# Patient Record
Sex: Female | Born: 1970 | Race: White | Hispanic: No | Marital: Married | Smoking: Never smoker
Health system: Southern US, Community
[De-identification: ages and names within clinical notes are randomized; demographics above are authoritative.]

## PROBLEM LIST (undated history)

## (undated) DIAGNOSIS — J42 Unspecified chronic bronchitis: Secondary | ICD-10-CM

## (undated) DIAGNOSIS — Z87442 Personal history of urinary calculi: Secondary | ICD-10-CM

## (undated) DIAGNOSIS — D649 Anemia, unspecified: Secondary | ICD-10-CM

## (undated) DIAGNOSIS — R519 Headache, unspecified: Secondary | ICD-10-CM

## (undated) DIAGNOSIS — J302 Other seasonal allergic rhinitis: Secondary | ICD-10-CM

## (undated) DIAGNOSIS — G709 Myoneural disorder, unspecified: Secondary | ICD-10-CM

## (undated) DIAGNOSIS — R51 Headache: Secondary | ICD-10-CM

## (undated) DIAGNOSIS — M533 Sacrococcygeal disorders, not elsewhere classified: Secondary | ICD-10-CM

## (undated) DIAGNOSIS — N63 Unspecified lump in unspecified breast: Secondary | ICD-10-CM

## (undated) HISTORY — PX: ROTATOR CUFF REPAIR: SHX139

## (undated) HISTORY — PX: DILITATION & CURRETTAGE/HYSTROSCOPY WITH NOVASURE ABLATION: SHX5568

## (undated) HISTORY — PX: TUBAL LIGATION: SHX77

## (undated) HISTORY — PX: KNEE SURGERY: SHX244

## (undated) HISTORY — PX: WISDOM TOOTH EXTRACTION: SHX21

## (undated) HISTORY — PX: NECK SURGERY: SHX720

---

## 2007-04-11 ENCOUNTER — Inpatient Hospital Stay (HOSPITAL_COMMUNITY): Admission: AD | Admit: 2007-04-11 | Discharge: 2007-04-12 | Payer: Self-pay | Admitting: Obstetrics and Gynecology

## 2007-08-05 ENCOUNTER — Inpatient Hospital Stay (HOSPITAL_COMMUNITY): Admission: AD | Admit: 2007-08-05 | Discharge: 2007-08-05 | Payer: Self-pay | Admitting: Obstetrics and Gynecology

## 2007-09-01 ENCOUNTER — Inpatient Hospital Stay (HOSPITAL_COMMUNITY): Admission: AD | Admit: 2007-09-01 | Discharge: 2007-09-04 | Payer: Self-pay | Admitting: Obstetrics and Gynecology

## 2007-09-03 ENCOUNTER — Encounter (INDEPENDENT_AMBULATORY_CARE_PROVIDER_SITE_OTHER): Payer: Self-pay | Admitting: Obstetrics and Gynecology

## 2010-08-12 NOTE — Op Note (Signed)
NAME:  Chelsea Fritz, Chelsea Fritz               ACCOUNT NO.:  [id number]   MEDICAL RECORD NO.:  [id number]          PATIENT TYPE:  INP   LOCATION:  9101                          FACILITY:  [facility]   PHYSICIAN:  Dineen Kid. Rana Snare, M.D.    DATE OF BIRTH:  [birth year]   DATE OF PROCEDURE:  09/03/2007  DATE OF DISCHARGE:                               OPERATIVE REPORT   .   PREOPERATIVE DIAGNOSIS:  Multiparity, desires sterility.   POSTOPERATIVE DIAGNOSIS:  Multiparity, desires sterility.   PROCEDURE:  Modified Pomeroy bilateral tubal ligation.   SURGEON:  Dineen Kid. Rana Snare, MD   ANESTHESIA:  Epidural.   INDICATIONS:  Ms. Motter is a 40 year old G4, now P4, status post  vaginal delivery yesterday with multiparity.  She desires sterility.  I  have discussed the risk and benefits of the procedure at length, which  include, but not limited to risk of infection, bleeding, damage to the  uterus, tubes, ovary, bowel, or bladder.  Risk of tubal failure quoted  at 5 out of 1000.  She gives an informed consent and wishes to proceed.   SURGEON:  Dineen Kid. Rana Snare, MD   DESCRIPTION OF PROCEDURE:  After adequate analgesia, the patient was  placed in the supine position.  She was sterilely prepped and draped.  The Allis clamp was placed at the umbilicus.  A 2-cm infraumbilical skin  incision was made, taken down sharply to the fascia, which was incised.  Peritoneum was entered sharply with hemostats and entered sharply.  [employer]er, water were placed.  The right fallopian tube was identified by  the fimbriated end.  The midportion of the fallopian tube was grasped  with a Babcock clamp, two sutures of 0-plain suture were doubly ligated  around the knuckle of fallopian tube, central portion was excised.  The  ostia were noted to be hemostatic.  A 2-0 silk ligation was placed on  the proximal portion of fallopian tube before the fallopian tube was  released back into the abdominal cavity.  The left fallopian tube  was  identified by the fimbriated end.  The midportion of the tube was  grasped with Babcock clamp.  Two sutures of 0-plain sutures were passed  around the fallopian tube, the mid portion excised.  Tubal ostia noted  be hemostatic.  Proximal portion was ligated with 2-0 silk.  The  fallopian tube was then released back into the abdominal cavity.  The  fascia was then closed with a 0-Vicryl in interrupted fashion.  The skin  was then closed with 3-0 Vicryl Rapide subcuticular suture.  A Dermabond  was then placed with a good  approximation and good hemostasis.  The patient had previously been  injected with 0.25% Marcaine.  A total of 10 mL was used.  The patient  tolerated the procedure well and was stable on transfer to recovery  room.  The sponge and instrument count was normal x3.  Estimated blood  loss was minimal.      Dineen Kid. Rana Snare, M.D.  Electronically Signed     DCL/MEDQ  D:  09/03/2007  T:  09/03/2007  Job:  [id number]

## 2010-12-18 LAB — URINALYSIS, ROUTINE W REFLEX MICROSCOPIC
Ketones, ur: NEGATIVE
Nitrite: NEGATIVE
Specific Gravity, Urine: 1.02
Urobilinogen, UA: 0.2
pH: 7

## 2010-12-18 LAB — KLEIHAUER-BETKE STAIN
Fetal Cells %: 0
Quantitation Fetal Hemoglobin: 0

## 2010-12-18 LAB — CBC
Hemoglobin: 11.7 — ABNORMAL LOW
Platelets: 235
RDW: 13.8

## 2010-12-18 LAB — ABO/RH: ABO/RH(D): O POS

## 2010-12-25 LAB — RPR: RPR Ser Ql: NONREACTIVE

## 2010-12-25 LAB — CBC
Hemoglobin: 11.9 — ABNORMAL LOW
MCHC: 34.2
Platelets: 175
Platelets: 210
RBC: 3.82 — ABNORMAL LOW
RDW: 14.4
WBC: 11 — ABNORMAL HIGH

## 2011-06-22 ENCOUNTER — Ambulatory Visit
Admission: RE | Admit: 2011-06-22 | Discharge: 2011-06-22 | Disposition: A | Discharge status: Home / Self Care | Payer: PRIVATE HEALTH INSURANCE | Source: Ambulatory Visit | Attending: Orthopedic Surgery | Admitting: Orthopedic Surgery

## 2011-06-22 ENCOUNTER — Other Ambulatory Visit: Payer: Self-pay | Admitting: Orthopedic Surgery

## 2011-06-22 DIAGNOSIS — R52 Pain, unspecified: Secondary | ICD-10-CM

## 2011-07-27 ENCOUNTER — Ambulatory Visit
Admission: RE | Admit: 2011-07-27 | Discharge: 2011-07-27 | Disposition: A | Discharge status: Home / Self Care | Payer: PRIVATE HEALTH INSURANCE | Source: Ambulatory Visit | Attending: Orthopedic Surgery | Admitting: Orthopedic Surgery

## 2011-07-27 ENCOUNTER — Other Ambulatory Visit: Payer: Self-pay | Admitting: Orthopedic Surgery

## 2011-07-27 DIAGNOSIS — R52 Pain, unspecified: Secondary | ICD-10-CM

## 2011-08-05 ENCOUNTER — Other Ambulatory Visit: Payer: Self-pay | Admitting: Obstetrics and Gynecology

## 2011-08-05 DIAGNOSIS — R928 Other abnormal and inconclusive findings on diagnostic imaging of breast: Secondary | ICD-10-CM

## 2011-08-12 ENCOUNTER — Ambulatory Visit
Admission: RE | Admit: 2011-08-12 | Discharge: 2011-08-12 | Disposition: A | Discharge status: Home / Self Care | Payer: BC Managed Care – PPO | Source: Ambulatory Visit | Attending: Obstetrics and Gynecology | Admitting: Obstetrics and Gynecology

## 2011-08-12 DIAGNOSIS — R928 Other abnormal and inconclusive findings on diagnostic imaging of breast: Secondary | ICD-10-CM

## 2012-06-20 ENCOUNTER — Ambulatory Visit (INDEPENDENT_AMBULATORY_CARE_PROVIDER_SITE_OTHER): Payer: BC Managed Care – PPO

## 2012-06-20 ENCOUNTER — Other Ambulatory Visit: Payer: Self-pay | Admitting: Orthopedic Surgery

## 2012-06-20 DIAGNOSIS — Z9889 Other specified postprocedural states: Secondary | ICD-10-CM

## 2013-07-12 ENCOUNTER — Other Ambulatory Visit: Payer: Self-pay | Admitting: Obstetrics and Gynecology

## 2013-07-12 DIAGNOSIS — N6311 Unspecified lump in the right breast, upper outer quadrant: Secondary | ICD-10-CM

## 2013-07-19 ENCOUNTER — Ambulatory Visit
Admission: RE | Admit: 2013-07-19 | Discharge: 2013-07-19 | Disposition: A | Discharge status: Home / Self Care | Payer: BC Managed Care – PPO | Source: Ambulatory Visit | Attending: Obstetrics and Gynecology | Admitting: Obstetrics and Gynecology

## 2013-07-19 ENCOUNTER — Encounter (INDEPENDENT_AMBULATORY_CARE_PROVIDER_SITE_OTHER): Payer: Self-pay

## 2013-07-19 DIAGNOSIS — N6311 Unspecified lump in the right breast, upper outer quadrant: Secondary | ICD-10-CM

## 2014-02-02 IMAGING — CR DG CERVICAL SPINE 2 OR 3 VIEWS
2 series · 2 of 2 positions shown · non-contrast
Comparison: None.

CLINICAL DATA: Postop disc replacement.  Pain across shoulders.

CERVICAL SPINE - 2-3 VIEW

[view not recorded (1 of 2)]
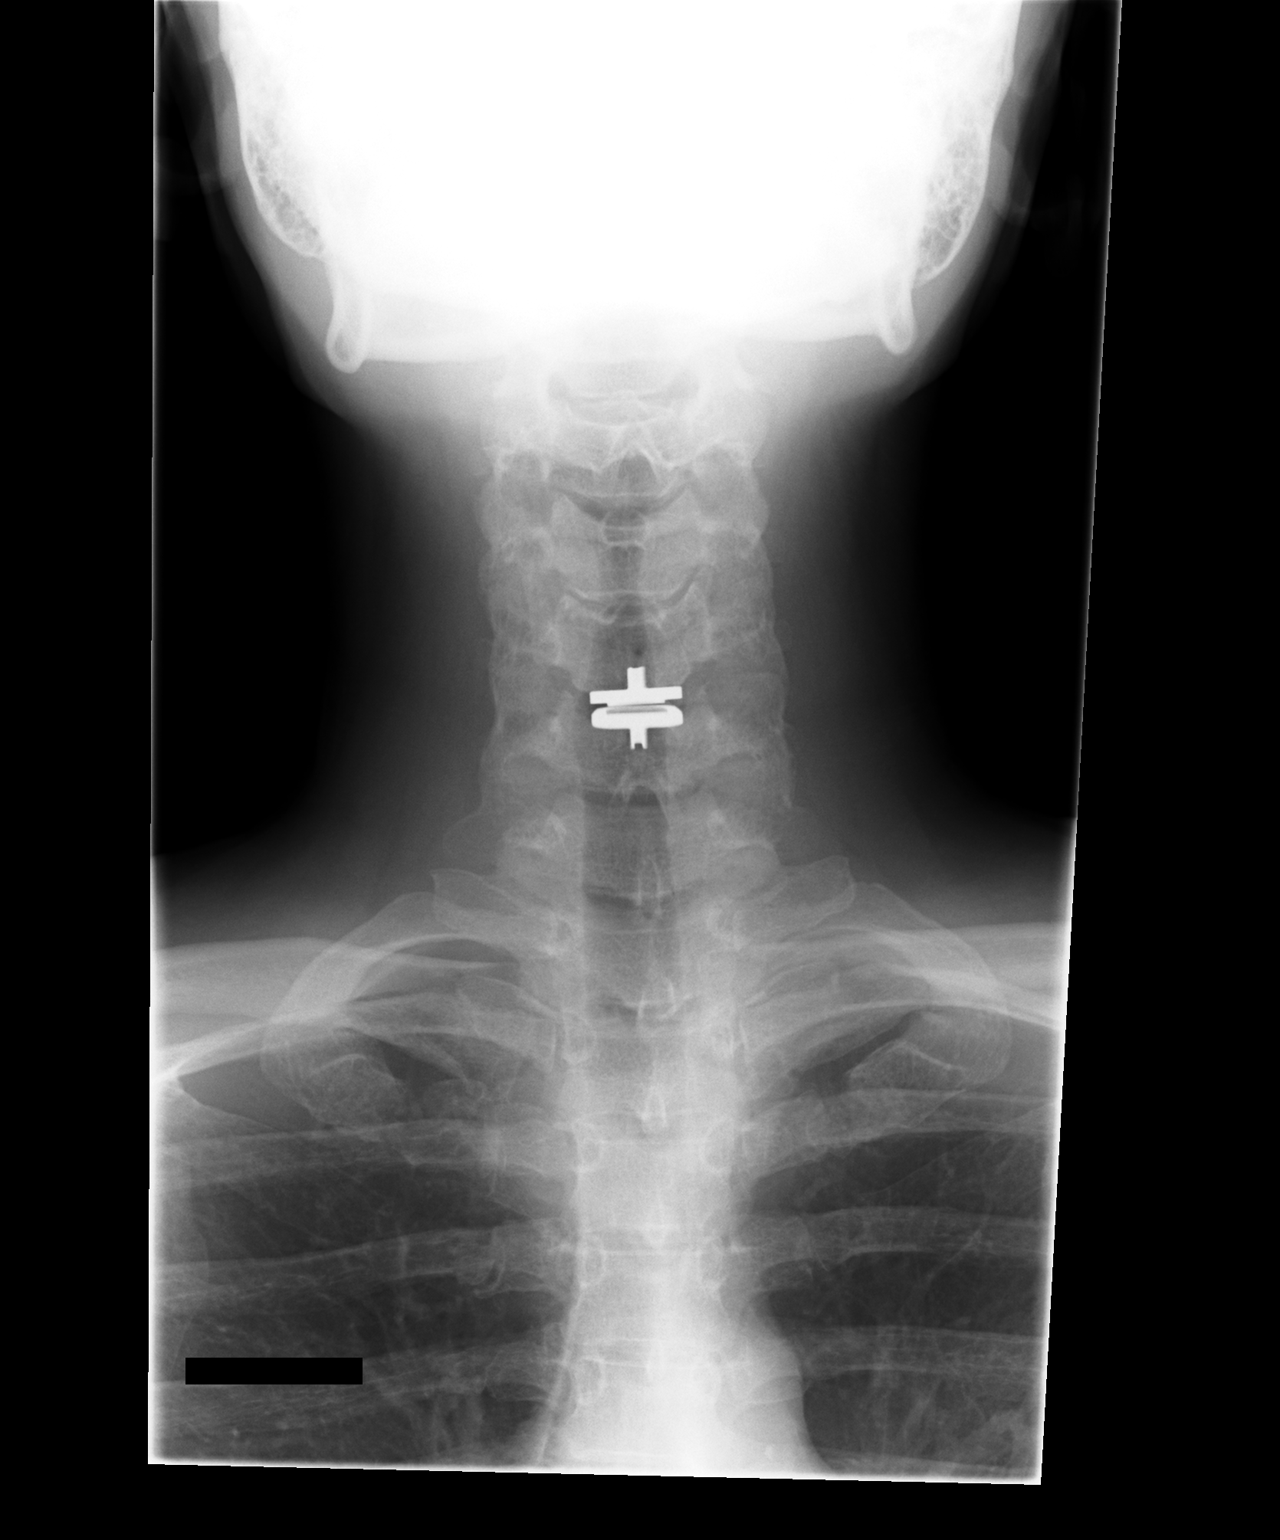

[view not recorded (2 of 2)]
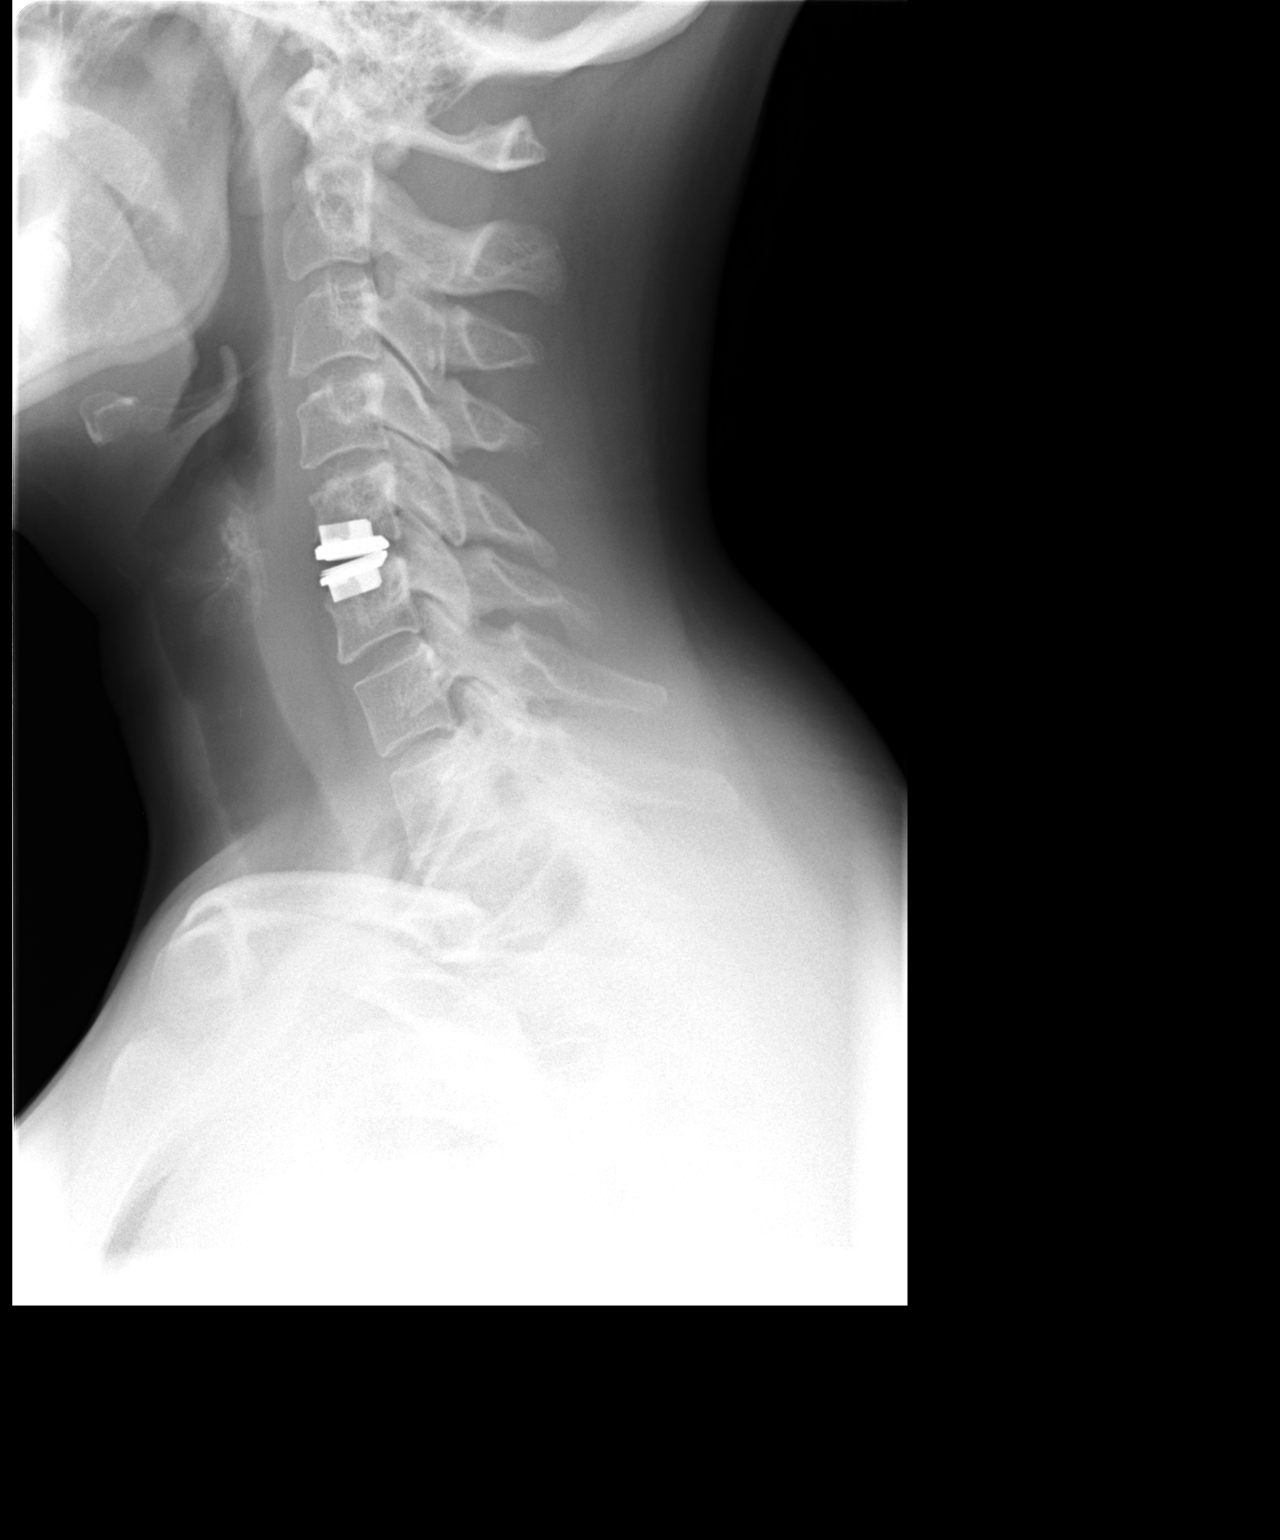

[2 of 2 positions shown; findings below may reference images not displayed]

FINDINGS: The cervical spine is visualized from the occiput to the
cervicothoracic junction.  Disc replacement is seen at C5-6.
Alignment is anatomic.  Vertebral body height is maintained.
Prevertebral soft tissues are within normal limits. Visualized lung
apices are clear.
IMPRESSION: Postoperative changes at C5-6.  No acute findings.

## 2014-10-04 NOTE — Patient Instructions (Addendum)
   Your procedure is scheduled on:  Tuesday, July 12  [facility]: [facility]  Pick up the phone at the desk and dial [phone] and inform us of your arrival.  Please call this number if you have any problems the morning of surgery: [phone]  Remember: Do not eat or drink after midnight: Monday Take these medicines the morning of surgery with a SIP OF WATER:  pulmicort inhaler and flonase nasal inhaler if needed   Do not wear jewelry, make-up, or FINGER nail polish No metal in your hair or on your body. Do not wear lotions, powders, perfumes.  You may wear deodorant.  Do not bring valuables to the hospital.  Leave suitcase in the car. After Surgery it may be brought to your room. For patients being admitted to the hospital, checkout time is 11:00am the day of discharge.  Home with husband Ethelene Brownsnthony cell [phone]

## 2014-10-05 ENCOUNTER — Encounter (HOSPITAL_COMMUNITY): Payer: Self-pay

## 2014-10-05 ENCOUNTER — Encounter (HOSPITAL_COMMUNITY)
Admission: RE | Admit: 2014-10-05 | Discharge: 2014-10-05 | Disposition: A | Discharge status: Home / Self Care | Payer: BC Managed Care – PPO | Source: Ambulatory Visit | Attending: Obstetrics and Gynecology | Admitting: Obstetrics and Gynecology

## 2014-10-05 DIAGNOSIS — Z01818 Encounter for other preprocedural examination: Secondary | ICD-10-CM | POA: Insufficient documentation

## 2014-10-05 DIAGNOSIS — N8189 Other female genital prolapse: Secondary | ICD-10-CM | POA: Insufficient documentation

## 2014-10-05 HISTORY — DX: Unspecified chronic bronchitis: J42

## 2014-10-05 HISTORY — DX: Personal history of urinary calculi: Z87.442

## 2014-10-05 HISTORY — DX: Other seasonal allergic rhinitis: J30.2

## 2014-10-05 HISTORY — DX: Headache, unspecified: R51.9

## 2014-10-05 HISTORY — DX: Myoneural disorder, unspecified: G70.9

## 2014-10-05 HISTORY — DX: Anemia, unspecified: D64.9

## 2014-10-05 HISTORY — DX: Headache: R51

## 2014-10-05 HISTORY — DX: Sacrococcygeal disorders, not elsewhere classified: M53.3

## 2014-10-05 LAB — CBC
HCT: 40.6 % (ref 36.0–46.0)
HEMOGLOBIN: 13.7 g/dL (ref 12.0–15.0)
MCH: 31.3 pg (ref 26.0–34.0)
MCHC: 33.7 g/dL (ref 30.0–36.0)
MCV: 92.7 fL (ref 78.0–100.0)
PLATELETS: 251 10*3/uL (ref 150–400)
RBC: 4.38 MIL/uL (ref 3.87–5.11)
RDW: 14.5 % (ref 11.5–15.5)
WBC: 10.4 10*3/uL (ref 4.0–10.5)

## 2014-10-05 LAB — TYPE AND SCREEN
ABO/RH(D): O POS
ANTIBODY SCREEN: NEGATIVE

## 2014-10-08 MED ORDER — GENTAMICIN SULFATE 40 MG/ML IJ SOLN
INTRAVENOUS | Status: AC
  Administered 2014-10-09: 113 mL via INTRAVENOUS
  Filled 2014-10-08: qty 7

## 2014-10-08 NOTE — Anesthesia Preprocedure Evaluation (Addendum)
Anesthesia Evaluation  Patient identified by MRN, date of birth, ID band Patient awake    Reviewed: Allergy & Precautions, NPO status , Patient's Chart, lab work & pertinent test results  History of Anesthesia Complications Negative for: history of anesthetic complications  Airway Mallampati: II  TM Distance: >3 FB Neck ROM: Full    Dental no notable dental hx. (+) Dental Advisory Given   Pulmonary neg pulmonary ROS,  Reactive lung disease with chronic bronchitis, denies any current symptoms, took pulmicort this am breath sounds clear to auscultation  Pulmonary exam normal       Cardiovascular negative cardio ROS Normal cardiovascular examRhythm:Regular Rate:Normal     Neuro/Psych  Headaches, negative psych ROS   GI/Hepatic negative GI ROS, Neg liver ROS,   Endo/Other  negative endocrine ROS  Renal/GU negative Renal ROS  negative genitourinary   Musculoskeletal negative musculoskeletal ROS (+)   Abdominal   Peds negative pediatric ROS (+)  Hematology  (+) anemia ,   Anesthesia Other Findings   Reproductive/Obstetrics negative OB ROS                            Anesthesia Physical Anesthesia Plan  ASA: II  Anesthesia Plan: General   Post-op Pain Management:    Induction: Intravenous  Airway Management Planned: Oral ETT  Additional Equipment:   Intra-op Plan:   Post-operative Plan: Extubation in OR  Informed Consent: I have reviewed the patients History and Physical, chart, labs and discussed the procedure including the risks, benefits and alternatives for the proposed anesthesia with the patient or authorized representative who has indicated his/her understanding and acceptance.   Dental advisory given  Plan Discussed with: CRNA  Anesthesia Plan Comments:         Anesthesia Quick Evaluation

## 2014-10-08 NOTE — H&P (Signed)
Chelsea Fritz is an 44 y.o. female G4P4 S/P BTL and EMA with symptomatic pelvic relaxation.  Pertinent Gynecological History: Menses: EMA Bleeding: N/A Contraception: tubal ligation DES exposure: denies Blood transfusions: none Sexually transmitted diseases: no past history Previous GYN Procedures: EMA  Last mammogram: normal Date: 2015 Last pap: normal Date: 2015 OB History: G4, P4   Menstrual History: Menarche age: unknown  No LMP recorded.    Past Medical History  Diagnosis Date  . Seasonal allergies   . Chronic bronchitis   . History of kidney stones     passed stone - no surgery required  . Headache     otc med prn, sleep  . Anemia   . 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 SI (sacroiliac) pain     ablations done - SI joint - no anesthesia, darvocet only  . SVD (spontaneous vaginal delivery)     x 4    Past Surgical History  Procedure Laterality Date  . Wisdom tooth extraction    . Neck surgery      ? C4-C5  . Tubal ligation    . Dilitation & currettage/hystroscopy with novasure ablation    . Rotator cuff repair      left  . Knee surgery      x 2 - left    No family history on file.  Social History:  reports that she has never smoked. She has never used smokeless tobacco. She reports that she drinks alcohol. She reports that she does not use illicit drugs.  Allergies:  Allergies  Allergen Reactions  . Amoxicillin Anaphylaxis  . Ceclor [Cefaclor] Anaphylaxis  . Erythromycin Anaphylaxis  . Keflex [Cephalexin] Anaphylaxis  . Other Anaphylaxis    With All seafood (eating), but no problems with dye  . Penicillins Anaphylaxis    No prescriptions prior to admission    Review of Systems  Constitutional: Negative for fever.    Weight 153 lb (69.4 kg). Physical Exam  Cardiovascular: Normal rate and regular rhythm.   Respiratory: Effort normal and breath sounds normal.  GI: Soft. There is no tenderness.  Genitourinary:  Uterus  6 weeks with second degree prolapse cystocele and recotcoele Adnexa without masses    No results found for this or any previous visit (from the past 24 hour(s)).  No results found.  Assessment/Plan: 44 yo for LAVH/bilat salpingectomy, sacrospinous ligament suspension and A&P repair Risks reviewed including infection, organ damage, bleeding/transfusion-HIV/hep, DVT/PE, pneumonia, fistula, pelvic pain, abdominal pain, painful intercourse, return to OR, laparotomy, vaginal narrowing, and recurrent prolapse.  Anel Purohit II,Jamey Demchak E 10/08/2014, 8:00 PM

## 2014-10-09 ENCOUNTER — Ambulatory Visit (HOSPITAL_COMMUNITY): Payer: BC Managed Care – PPO | Admitting: Anesthesiology

## 2014-10-09 ENCOUNTER — Encounter (HOSPITAL_COMMUNITY)
Admission: RE | Disposition: A | Discharge status: Home / Self Care | Payer: Self-pay | Source: Ambulatory Visit | Attending: Obstetrics and Gynecology

## 2014-10-09 ENCOUNTER — Encounter (HOSPITAL_COMMUNITY): Payer: Self-pay | Admitting: *Deleted

## 2014-10-09 ENCOUNTER — Observation Stay (HOSPITAL_COMMUNITY)
Admission: RE | Admit: 2014-10-09 | Discharge: 2014-10-10 | Disposition: A | Discharge status: Home / Self Care | Payer: BC Managed Care – PPO | Source: Ambulatory Visit | Attending: Obstetrics and Gynecology | Admitting: Obstetrics and Gynecology

## 2014-10-09 DIAGNOSIS — J42 Unspecified chronic bronchitis: Secondary | ICD-10-CM | POA: Diagnosis not present

## 2014-10-09 DIAGNOSIS — N800 Endometriosis of uterus: Secondary | ICD-10-CM | POA: Diagnosis not present

## 2014-10-09 DIAGNOSIS — D649 Anemia, unspecified: Secondary | ICD-10-CM | POA: Diagnosis not present

## 2014-10-09 DIAGNOSIS — N8189 Other female genital prolapse: Secondary | ICD-10-CM | POA: Diagnosis present

## 2014-10-09 DIAGNOSIS — Z880 Allergy status to penicillin: Secondary | ICD-10-CM | POA: Diagnosis not present

## 2014-10-09 DIAGNOSIS — N812 Incomplete uterovaginal prolapse: Principal | ICD-10-CM | POA: Insufficient documentation

## 2014-10-09 DIAGNOSIS — Z79899 Other long term (current) drug therapy: Secondary | ICD-10-CM | POA: Diagnosis not present

## 2014-10-09 DIAGNOSIS — N838 Other noninflammatory disorders of ovary, fallopian tube and broad ligament: Secondary | ICD-10-CM | POA: Diagnosis not present

## 2014-10-09 HISTORY — PX: BILATERAL SALPINGECTOMY: SHX5743

## 2014-10-09 HISTORY — PX: LAPAROSCOPIC ASSISTED VAGINAL HYSTERECTOMY: SHX5398

## 2014-10-09 HISTORY — PX: ANTERIOR AND POSTERIOR REPAIR WITH SACROSPINOUS FIXATION: SHX6536

## 2014-10-09 LAB — TYPE AND SCREEN
ABO/RH(D): O POS
Antibody Screen: NEGATIVE

## 2014-10-09 SURGERY — HYSTERECTOMY, VAGINAL, LAPAROSCOPY-ASSISTED
Anesthesia: General | Site: Vagina

## 2014-10-09 MED ORDER — ESTRADIOL 0.1 MG/GM VA CREA
TOPICAL_CREAM | VAGINAL | Status: AC
  Filled 2014-10-09: qty 42.5

## 2014-10-09 MED ORDER — HYDROMORPHONE 0.3 MG/ML IV SOLN
INTRAVENOUS | Status: DC
  Administered 2014-10-09: 11:00:00 via INTRAVENOUS
  Filled 2014-10-09: qty 25

## 2014-10-09 MED ORDER — ONDANSETRON HCL 4 MG/2ML IJ SOLN
INTRAMUSCULAR | Status: DC | PRN
  Administered 2014-10-09: 4 mg via INTRAVENOUS

## 2014-10-09 MED ORDER — FENTANYL CITRATE (PF) 250 MCG/5ML IJ SOLN
INTRAMUSCULAR | Status: AC
  Filled 2014-10-09: qty 5

## 2014-10-09 MED ORDER — DIPHENHYDRAMINE HCL 50 MG/ML IJ SOLN
12.5000 mg | Freq: Four times a day (QID) | INTRAMUSCULAR | Status: DC | PRN
Start: 2014-10-09 — End: 2014-10-10

## 2014-10-09 MED ORDER — FENTANYL CITRATE (PF) 100 MCG/2ML IJ SOLN
INTRAMUSCULAR | Status: AC
  Filled 2014-10-09: qty 2

## 2014-10-09 MED ORDER — MONTELUKAST SODIUM 10 MG PO TABS
10.0000 mg | ORAL_TABLET | Freq: Every day | ORAL | Status: DC
  Administered 2014-10-09: 10 mg via ORAL
  Filled 2014-10-09: qty 1

## 2014-10-09 MED ORDER — SCOPOLAMINE 1 MG/3DAYS TD PT72
MEDICATED_PATCH | TRANSDERMAL | Status: AC
  Administered 2014-10-09: 1.5 mg via TRANSDERMAL
  Filled 2014-10-09: qty 1

## 2014-10-09 MED ORDER — MIDAZOLAM HCL 2 MG/2ML IJ SOLN
INTRAMUSCULAR | Status: DC | PRN
  Administered 2014-10-09: 2 mg via INTRAVENOUS

## 2014-10-09 MED ORDER — BUPIVACAINE HCL (PF) 0.5 % IJ SOLN
INTRAMUSCULAR | Status: DC | PRN
  Administered 2014-10-09: 10 mL

## 2014-10-09 MED ORDER — FLUTICASONE PROPIONATE 50 MCG/ACT NA SUSP
1.0000 | Freq: Every day | NASAL | Status: DC
  Administered 2014-10-10: 1 via NASAL
  Filled 2014-10-09: qty 16

## 2014-10-09 MED ORDER — MAGNESIUM HYDROXIDE 400 MG/5ML PO SUSP: 30.0000 mL | Freq: Every day | ORAL | Status: DC | PRN

## 2014-10-09 MED ORDER — IBUPROFEN 600 MG PO TABS
600.0000 mg | ORAL_TABLET | Freq: Four times a day (QID) | ORAL | Status: DC | PRN
  Administered 2014-10-10: 600 mg via ORAL
  Filled 2014-10-09: qty 1

## 2014-10-09 MED ORDER — ALBUTEROL SULFATE HFA 108 (90 BASE) MCG/ACT IN AERS
1.0000 | INHALATION_SPRAY | Freq: Four times a day (QID) | RESPIRATORY_TRACT | Status: DC | PRN
  Administered 2014-10-09: 1 via RESPIRATORY_TRACT
  Filled 2014-10-09: qty 6.7

## 2014-10-09 MED ORDER — GLYCOPYRROLATE 0.2 MG/ML IJ SOLN
INTRAMUSCULAR | Status: DC | PRN
  Administered 2014-10-09: 0.4 mg via INTRAVENOUS

## 2014-10-09 MED ORDER — ONDANSETRON HCL 4 MG/2ML IJ SOLN
4.0000 mg | Freq: Four times a day (QID) | INTRAMUSCULAR | Status: DC | PRN
  Administered 2014-10-09: 4 mg via INTRAVENOUS
  Filled 2014-10-09: qty 2

## 2014-10-09 MED ORDER — PROPOFOL 10 MG/ML IV BOLUS
INTRAVENOUS | Status: AC
  Filled 2014-10-09: qty 20

## 2014-10-09 MED ORDER — MENTHOL 3 MG MT LOZG
1.0000 | LOZENGE | OROMUCOSAL | Status: DC | PRN
  Administered 2014-10-09: 3 mg via ORAL
  Filled 2014-10-09: qty 9

## 2014-10-09 MED ORDER — PROPOFOL 10 MG/ML IV BOLUS
INTRAVENOUS | Status: DC | PRN
  Administered 2014-10-09: 150 mg via INTRAVENOUS

## 2014-10-09 MED ORDER — LIDOCAINE-EPINEPHRINE 0.5 %-1:200000 IJ SOLN
INTRAMUSCULAR | Status: DC | PRN
  Administered 2014-10-09: 17 mL

## 2014-10-09 MED ORDER — BUDESONIDE 0.25 MG/2ML IN SUSP
0.2500 mg | Freq: Two times a day (BID) | RESPIRATORY_TRACT | Status: DC
  Administered 2014-10-09 – 2014-10-10 (×3): 0.25 mg via RESPIRATORY_TRACT
  Filled 2014-10-09 (×3): qty 2

## 2014-10-09 MED ORDER — SENNA 8.6 MG PO TABS
1.0000 | ORAL_TABLET | Freq: Two times a day (BID) | ORAL | Status: DC
  Administered 2014-10-09 – 2014-10-10 (×2): 8.6 mg via ORAL
  Filled 2014-10-09 (×3): qty 1

## 2014-10-09 MED ORDER — SUCCINYLCHOLINE CHLORIDE 20 MG/ML IJ SOLN
INTRAMUSCULAR | Status: AC
  Filled 2014-10-09: qty 1

## 2014-10-09 MED ORDER — LACTATED RINGERS IV BOLUS (SEPSIS)
250.0000 mL | Freq: Once | INTRAVENOUS | Status: AC
  Administered 2014-10-09: 250 mL via INTRAVENOUS

## 2014-10-09 MED ORDER — LIDOCAINE HCL (CARDIAC) 20 MG/ML IV SOLN
INTRAVENOUS | Status: AC
  Filled 2014-10-09: qty 5

## 2014-10-09 MED ORDER — LACTATED RINGERS IR SOLN
Status: DC | PRN
  Administered 2014-10-09: 3000 mL

## 2014-10-09 MED ORDER — DIPHENHYDRAMINE HCL 12.5 MG/5ML PO ELIX
12.5000 mg | ORAL_SOLUTION | Freq: Four times a day (QID) | ORAL | Status: DC | PRN
  Administered 2014-10-10: 12.5 mg via ORAL
  Filled 2014-10-09 (×2): qty 5

## 2014-10-09 MED ORDER — NEOSTIGMINE METHYLSULFATE 10 MG/10ML IV SOLN
INTRAVENOUS | Status: DC | PRN
  Administered 2014-10-09: 3 mg via INTRAVENOUS

## 2014-10-09 MED ORDER — HYDROMORPHONE HCL 1 MG/ML IJ SOLN
0.2500 mg | INTRAMUSCULAR | Status: DC | PRN
  Administered 2014-10-09 (×3): 0.25 mg via INTRAVENOUS

## 2014-10-09 MED ORDER — SODIUM CHLORIDE 0.9 % IJ SOLN
INTRAMUSCULAR | Status: AC
  Filled 2014-10-09: qty 100

## 2014-10-09 MED ORDER — PHENYLEPHRINE 40 MCG/ML (10ML) SYRINGE FOR IV PUSH (FOR BLOOD PRESSURE SUPPORT)
PREFILLED_SYRINGE | INTRAVENOUS | Status: AC
  Filled 2014-10-09: qty 10

## 2014-10-09 MED ORDER — HYDROMORPHONE HCL 1 MG/ML IJ SOLN
INTRAMUSCULAR | Status: AC
  Filled 2014-10-09: qty 1

## 2014-10-09 MED ORDER — SUCCINYLCHOLINE CHLORIDE 20 MG/ML IJ SOLN
INTRAMUSCULAR | Status: DC | PRN
  Administered 2014-10-09: 85 mg via INTRAVENOUS

## 2014-10-09 MED ORDER — DEXAMETHASONE SODIUM PHOSPHATE 4 MG/ML IJ SOLN
INTRAMUSCULAR | Status: AC
  Filled 2014-10-09: qty 1

## 2014-10-09 MED ORDER — LIDOCAINE HCL (CARDIAC) 20 MG/ML IV SOLN
INTRAVENOUS | Status: DC | PRN
  Administered 2014-10-09: 60 mg via INTRAVENOUS

## 2014-10-09 MED ORDER — ONDANSETRON HCL 4 MG/2ML IJ SOLN: 4.0000 mg | Freq: Four times a day (QID) | INTRAMUSCULAR | Status: DC | PRN

## 2014-10-09 MED ORDER — EPHEDRINE SULFATE 50 MG/ML IJ SOLN
INTRAMUSCULAR | Status: DC | PRN
  Administered 2014-10-09: 10 mg via INTRAVENOUS

## 2014-10-09 MED ORDER — ROCURONIUM BROMIDE 100 MG/10ML IV SOLN
INTRAVENOUS | Status: AC
  Filled 2014-10-09: qty 1

## 2014-10-09 MED ORDER — SCOPOLAMINE 1 MG/3DAYS TD PT72
1.0000 | MEDICATED_PATCH | Freq: Once | TRANSDERMAL | Status: DC
  Administered 2014-10-09: 1.5 mg via TRANSDERMAL

## 2014-10-09 MED ORDER — PHENYLEPHRINE HCL 10 MG/ML IJ SOLN
INTRAMUSCULAR | Status: DC | PRN
  Administered 2014-10-09: .08 mg via INTRAVENOUS

## 2014-10-09 MED ORDER — SODIUM CHLORIDE 0.9 % IJ SOLN: 9.0000 mL | INTRAMUSCULAR | Status: DC | PRN

## 2014-10-09 MED ORDER — KETOROLAC TROMETHAMINE 30 MG/ML IJ SOLN
INTRAMUSCULAR | Status: AC
  Filled 2014-10-09: qty 1

## 2014-10-09 MED ORDER — FENTANYL CITRATE (PF) 100 MCG/2ML IJ SOLN
25.0000 ug | INTRAMUSCULAR | Status: DC | PRN
  Administered 2014-10-09 (×3): 50 ug via INTRAVENOUS

## 2014-10-09 MED ORDER — FENTANYL CITRATE (PF) 100 MCG/2ML IJ SOLN
INTRAMUSCULAR | Status: DC | PRN
  Administered 2014-10-09 (×2): 100 ug via INTRAVENOUS
  Administered 2014-10-09 (×4): 50 ug via INTRAVENOUS
  Administered 2014-10-09: 100 ug via INTRAVENOUS

## 2014-10-09 MED ORDER — ONDANSETRON HCL 4 MG/2ML IJ SOLN
INTRAMUSCULAR | Status: AC
  Filled 2014-10-09: qty 2

## 2014-10-09 MED ORDER — LIDOCAINE HCL (PF) 0.5 % IJ SOLN
INTRAMUSCULAR | Status: AC
  Filled 2014-10-09: qty 50

## 2014-10-09 MED ORDER — KETOROLAC TROMETHAMINE 30 MG/ML IJ SOLN
30.0000 mg | Freq: Three times a day (TID) | INTRAMUSCULAR | Status: DC | PRN
  Administered 2014-10-09 – 2014-10-10 (×3): 30 mg via INTRAVENOUS
  Filled 2014-10-09 (×2): qty 1

## 2014-10-09 MED ORDER — BUPIVACAINE HCL (PF) 0.5 % IJ SOLN
INTRAMUSCULAR | Status: AC
  Filled 2014-10-09: qty 30

## 2014-10-09 MED ORDER — OXYCODONE-ACETAMINOPHEN 5-325 MG PO TABS
1.0000 | ORAL_TABLET | ORAL | Status: DC | PRN
  Administered 2014-10-09 (×2): 1 via ORAL
  Administered 2014-10-10 (×2): 2 via ORAL
  Filled 2014-10-09: qty 2
  Filled 2014-10-09: qty 1
  Filled 2014-10-09: qty 2
  Filled 2014-10-09: qty 1

## 2014-10-09 MED ORDER — BUPIVACAINE-EPINEPHRINE (PF) 0.5% -1:200000 IJ SOLN
INTRAMUSCULAR | Status: AC
  Filled 2014-10-09: qty 30

## 2014-10-09 MED ORDER — ONDANSETRON HCL 4 MG PO TABS: 4.0000 mg | ORAL_TABLET | Freq: Four times a day (QID) | ORAL | Status: DC | PRN

## 2014-10-09 MED ORDER — DEXAMETHASONE SODIUM PHOSPHATE 10 MG/ML IJ SOLN
INTRAMUSCULAR | Status: DC | PRN
  Administered 2014-10-09: 4 mg via INTRAVENOUS

## 2014-10-09 MED ORDER — MIDAZOLAM HCL 2 MG/2ML IJ SOLN
INTRAMUSCULAR | Status: AC
  Filled 2014-10-09: qty 2

## 2014-10-09 MED ORDER — ZOLPIDEM TARTRATE 5 MG PO TABS: 5.0000 mg | ORAL_TABLET | Freq: Every evening | ORAL | Status: DC | PRN

## 2014-10-09 MED ORDER — LACTATED RINGERS IV SOLN
INTRAVENOUS | Status: DC
  Administered 2014-10-09 (×3): via INTRAVENOUS

## 2014-10-09 MED ORDER — ONDANSETRON HCL 4 MG/2ML IJ SOLN: 4.0000 mg | Freq: Once | INTRAMUSCULAR | Status: DC | PRN

## 2014-10-09 MED ORDER — ROCURONIUM BROMIDE 100 MG/10ML IV SOLN
INTRAVENOUS | Status: DC | PRN
  Administered 2014-10-09: 5 mg via INTRAVENOUS
  Administered 2014-10-09: 45 mg via INTRAVENOUS

## 2014-10-09 MED ORDER — LACTATED RINGERS IV SOLN
INTRAVENOUS | Status: DC
  Administered 2014-10-09 – 2014-10-10 (×3): via INTRAVENOUS

## 2014-10-09 MED ORDER — NALOXONE HCL 0.4 MG/ML IJ SOLN: 0.4000 mg | INTRAMUSCULAR | Status: DC | PRN

## 2014-10-09 MED ORDER — ALUM & MAG HYDROXIDE-SIMETH 200-200-20 MG/5ML PO SUSP: 30.0000 mL | ORAL | Status: DC | PRN

## 2014-10-09 MED ORDER — EPHEDRINE 5 MG/ML INJ
INTRAVENOUS | Status: AC
  Filled 2014-10-09: qty 10

## 2014-10-09 MED ORDER — HYDROMORPHONE 0.3 MG/ML IV SOLN
INTRAVENOUS | Status: DC
  Administered 2014-10-09: 19:00:00 via INTRAVENOUS
  Administered 2014-10-09: 2.59 mg via INTRAVENOUS
  Administered 2014-10-09: 3.39 mg via INTRAVENOUS
  Administered 2014-10-09: 1.77 mg via INTRAVENOUS
  Filled 2014-10-09: qty 25

## 2014-10-09 SURGICAL SUPPLY — 62 items
BLADE SURG 11 STRL SS (BLADE) IMPLANT
BLADE SURG 15 STRL LF C SS BP (BLADE) ×3 IMPLANT
BLADE SURG 15 STRL SS (BLADE) ×2
CABLE HIGH FREQUENCY MONO STRZ (ELECTRODE) IMPLANT
CATH ROBINSON RED A/P 16FR (CATHETERS) ×5 IMPLANT
CLOTH BEACON ORANGE TIMEOUT ST (SAFETY) ×5 IMPLANT
CONT PATH 16OZ SNAP LID 3702 (MISCELLANEOUS) ×5 IMPLANT
COVER BACK TABLE 60X90IN (DRAPES) ×5 IMPLANT
DECANTER SPIKE VIAL GLASS SM (MISCELLANEOUS) ×5 IMPLANT
DEVICE CAPIO SLIM SINGLE (INSTRUMENTS) ×5 IMPLANT
DISSECTOR SPONGE CHERRY (GAUZE/BANDAGES/DRESSINGS) IMPLANT
DRSG COVADERM PLUS 2X2 (GAUZE/BANDAGES/DRESSINGS) ×10 IMPLANT
DRSG OPSITE POSTOP 3X4 (GAUZE/BANDAGES/DRESSINGS) ×5 IMPLANT
DURAPREP 26ML APPLICATOR (WOUND CARE) ×5 IMPLANT
ELECT LIGASURE LONG (ELECTRODE) ×5 IMPLANT
ELECT REM PT RETURN 9FT ADLT (ELECTROSURGICAL)
ELECTRODE REM PT RTRN 9FT ADLT (ELECTROSURGICAL) IMPLANT
FILTER SMOKE EVAC LAPAROSHD (FILTER) ×5 IMPLANT
GAUZE PACKING 1 X5 YD ST (GAUZE/BANDAGES/DRESSINGS) ×5 IMPLANT
GAUZE SPONGE 4X4 16PLY XRAY LF (GAUZE/BANDAGES/DRESSINGS) ×5 IMPLANT
GLOVE BIO SURGEON STRL SZ7.5 (GLOVE) ×10 IMPLANT
GLOVE BIOGEL PI IND STRL 6.5 (GLOVE) ×3 IMPLANT
GLOVE BIOGEL PI IND STRL 8 (GLOVE) ×3 IMPLANT
GLOVE BIOGEL PI INDICATOR 6.5 (GLOVE) ×2
GLOVE BIOGEL PI INDICATOR 8 (GLOVE) ×2
GOWN STRL REUS W/ TWL XL LVL3 (GOWN DISPOSABLE) ×6 IMPLANT
GOWN STRL REUS W/TWL LRG LVL3 (GOWN DISPOSABLE) ×20 IMPLANT
GOWN STRL REUS W/TWL XL LVL3 (GOWN DISPOSABLE) ×4
LIQUID BAND (GAUZE/BANDAGES/DRESSINGS) ×5 IMPLANT
NEEDLE HYPO 22GX1.5 SAFETY (NEEDLE) IMPLANT
NEEDLE HYPO 25X5/8 SAFETYGLIDE (NEEDLE) ×5 IMPLANT
NEEDLE INSUFFLATION 120MM (ENDOMECHANICALS) ×5 IMPLANT
NEEDLE MAYO .5 CIRCLE (NEEDLE) ×5 IMPLANT
NS IRRIG 1000ML POUR BTL (IV SOLUTION) ×5 IMPLANT
PACK LAVH (CUSTOM PROCEDURE TRAY) ×5 IMPLANT
PACK ROBOTIC GOWN (GOWN DISPOSABLE) ×5 IMPLANT
PACK VAGINAL WOMENS (CUSTOM PROCEDURE TRAY) ×5 IMPLANT
PAD POSITIONER PINK NONSTERILE (MISCELLANEOUS) ×5 IMPLANT
PLUG CATH AND CAP STER (CATHETERS) IMPLANT
SCISSORS LAP 5X45 EPIX DISP (ENDOMECHANICALS) IMPLANT
SEALER TISSUE G2 CVD JAW 45CM (ENDOMECHANICALS) ×5 IMPLANT
SET CYSTO W/LG BORE CLAMP LF (SET/KITS/TRAYS/PACK) IMPLANT
SET IRRIG TUBING LAPAROSCOPIC (IRRIGATION / IRRIGATOR) IMPLANT
SUT CAPIO POLYGLYCOLIC (SUTURE) ×5 IMPLANT
SUT MNCRL 0 MO-4 VIOLET 18 CR (SUTURE) ×3 IMPLANT
SUT MNCRL 0 VIOLET 6X18 (SUTURE) ×3 IMPLANT
SUT MON AB 2-0 CT1 27 (SUTURE) ×10 IMPLANT
SUT MONOCRYL 0 6X18 (SUTURE) ×2
SUT MONOCRYL 0 MO 4 18  CR/8 (SUTURE) ×2
SUT PLAIN 2 0 (SUTURE)
SUT PLAIN ABS 2-0 54XMFL TIE (SUTURE) IMPLANT
SUT VIC AB 2-0 CT2 27 (SUTURE) ×15 IMPLANT
SUT VIC AB 2-0 UR6 27 (SUTURE) ×10 IMPLANT
SUT VIC AB 4-0 PS2 27 (SUTURE) ×5 IMPLANT
SUT VICRYL 0 UR6 27IN ABS (SUTURE) IMPLANT
SYR 30ML LL (SYRINGE) ×5 IMPLANT
TOWEL OR 17X24 6PK STRL BLUE (TOWEL DISPOSABLE) ×10 IMPLANT
TRAY FOLEY CATH SILVER 14FR (SET/KITS/TRAYS/PACK) ×5 IMPLANT
TROCAR XCEL NON-BLD 11X100MML (ENDOMECHANICALS) ×5 IMPLANT
TROCAR XCEL NON-BLD 5MMX100MML (ENDOMECHANICALS) ×5 IMPLANT
WARMER LAPAROSCOPE (MISCELLANEOUS) ×5 IMPLANT
WATER STERILE IRR 1000ML POUR (IV SOLUTION) ×5 IMPLANT

## 2014-10-09 NOTE — Anesthesia Postprocedure Evaluation (Signed)
  Anesthesia Post-op Note  Patient: Chelsea Fritz  Procedure(s) Performed: Procedure(s) (LRB): LAPAROSCOPIC ASSISTED VAGINAL HYSTERECTOMY (N/A) ANTERIOR AND POSTERIOR REPAIR WITH SACROSPINOUS LIGAMENT SUSPENSION (N/A) BILATERAL SALPINGECTOMY (Bilateral)  Patient Location: PACU  Anesthesia Type: General  Level of Consciousness: awake and alert   Airway and Oxygen Therapy: Patient Spontanous Breathing  Post-op Pain: mild  Post-op Assessment: Post-op Vital signs reviewed, Patient's Cardiovascular Status Stable, Respiratory Function Stable, Patent Airway and No signs of Nausea or vomiting  Last Vitals:  Filed Vitals:   10/09/14 0930  BP: 111/54  Pulse: 89  Temp: 37.1 C  Resp: 8    Post-op Vital Signs: stable   Complications: No apparent anesthesia complications  

## 2014-10-09 NOTE — Addendum Note (Signed)
Addendum  created 10/09/14 1558 by Elgie CongoNataliya H Jahnessa Vanduyn, CRNA   Modules edited: Notes Section   Notes Section:  File: [id number]

## 2014-10-09 NOTE — Anesthesia Procedure Notes (Signed)
Procedure Name: Intubation Date/Time: 10/09/2014 7:31 AM Performed by: Shanon PayorGREGORY, Zyan Coby M Pre-anesthesia Checklist: Patient identified, Emergency Drugs available, Suction available, Patient being monitored and Timeout performed Patient Re-evaluated:Patient Re-evaluated prior to inductionOxygen Delivery Method: Circle system utilized Preoxygenation: Pre-oxygenation with 100% oxygen Intubation Type: IV induction Ventilation: Mask ventilation without difficulty Grade View: Grade I Tube type: Oral Tube size: 7.0 mm Number of attempts: 1 Airway Equipment and Method: Stylet Placement Confirmation: ETT inserted through vocal cords under direct vision,  positive ETCO2 and breath sounds checked- equal and bilateral Secured at: 20 cm Tube secured with: Tape Dental Injury: Teeth and Oropharynx as per pre-operative assessment

## 2014-10-09 NOTE — Progress Notes (Signed)
Tolerating liquids well. Has some rectal discomfort but otherwise feels fine  VSS Afeb UO clear, concentrated Lungs CTA Cor RRR Abd soft, good BS Ext PAS on  A/P: IV fluid bolus

## 2014-10-09 NOTE — Anesthesia Postprocedure Evaluation (Signed)
  Anesthesia Post-op Note  Patient: Chelsea Fritz  Procedure(s) Performed: Procedure(s): LAPAROSCOPIC ASSISTED VAGINAL HYSTERECTOMY (N/A) ANTERIOR AND POSTERIOR REPAIR WITH SACROSPINOUS LIGAMENT SUSPENSION (N/A) BILATERAL SALPINGECTOMY (Bilateral)  Patient Location: [facility]  Anesthesia Type:General  Level of Consciousness: awake, alert  and oriented  Airway and Oxygen Therapy: Patient Spontanous Breathing and Patient connected to nasal cannula oxygen  Post-op Pain: none  Post-op Assessment: Post-op Vital signs reviewed and Patient's Cardiovascular Status Stable              Post-op Vital Signs: Reviewed and stable  Last Vitals:  Filed Vitals:   10/09/14 1415  BP: 104/72  Pulse: 79  Temp: 36.8 C  Resp: 16    Complications: No apparent anesthesia complications

## 2014-10-09 NOTE — Brief Op Note (Signed)
10/09/2014  9:28 AM  PATIENT:  Chelsea Fritz  44 y.o. female  PRE-OPERATIVE DIAGNOSIS:  pelvic relaxation  POST-OPERATIVE DIAGNOSIS:  pelvic relaxation  PROCEDURE:  Procedure(s): LAPAROSCOPIC ASSISTED VAGINAL HYSTERECTOMY (N/A) ANTERIOR AND POSTERIOR REPAIR WITH SACROSPINOUS LIGAMENT SUSPENSION (N/A) BILATERAL SALPINGECTOMY (Bilateral)  SURGEON:  Surgeon(s) and Role:    * Harold HedgeJames Quana Chamberlain, MD - Primary    * Richardean ChimeraJohn McComb, MD - Assisting  PHYSICIAN ASSISTANT:   ASSISTANTS: McComb   ANESTHESIA:   general  EBL:  Total I/O In: 2000 [I.V.:2000] Out: 540 [Urine:240; Blood:300]  BLOOD ADMINISTERED:none  DRAINS: Urinary Catheter (Foley)   LOCAL MEDICATIONS USED:  MARCAINE     SPECIMEN:  Source of Specimen:  uterus, bilateral tubes  DISPOSITION OF SPECIMEN:  PATHOLOGY  COUNTS:  YES  TOURNIQUET:  * No tourniquets in log *  DICTATION: .Other Dictation: Dictation Number [id number]  PLAN OF CARE: Admit for overnight observation  PATIENT DISPOSITION:  PACU - hemodynamically stable.   Delay start of Pharmacological VTE agent (>24hrs) due to surgical blood loss or risk of bleeding: not applicable

## 2014-10-09 NOTE — Transfer of Care (Signed)
Immediate Anesthesia Transfer of Care Note  Patient: Chelsea Fritz  Procedure(s) Performed: Procedure(s): LAPAROSCOPIC ASSISTED VAGINAL HYSTERECTOMY (N/A) ANTERIOR AND POSTERIOR REPAIR WITH SACROSPINOUS LIGAMENT SUSPENSION (N/A) BILATERAL SALPINGECTOMY (Bilateral)  Patient Location: PACU  Anesthesia Type:General  Level of Consciousness: awake, alert  and oriented  Airway & Oxygen Therapy: Patient Spontanous Breathing and Patient connected to nasal cannula oxygen  Post-op Assessment: Report given to RN and Post -op Vital signs reviewed and stable  Post vital signs: Reviewed and stable  Last Vitals:  Filed Vitals:   10/09/14 0626  BP: 108/66  Pulse: 81  Temp: 36.8 C  Resp: 16    Complications: No apparent anesthesia complications

## 2014-10-09 NOTE — Anesthesia Postprocedure Evaluation (Signed)
  Anesthesia Post-op Note  Patient: Chelsea Fritz  Procedure(s) Performed: Procedure(s) (LRB): LAPAROSCOPIC ASSISTED VAGINAL HYSTERECTOMY (N/A) ANTERIOR AND POSTERIOR REPAIR WITH SACROSPINOUS LIGAMENT SUSPENSION (N/A) BILATERAL SALPINGECTOMY (Bilateral)  Patient Location: PACU  Anesthesia Type: General  Level of Consciousness: awake and alert   Airway and Oxygen Therapy: Patient Spontanous Breathing  Post-op Pain: mild  Post-op Assessment: Post-op Vital signs reviewed, Patient's Cardiovascular Status Stable, Respiratory Function Stable, Patent Airway and No signs of Nausea or vomiting  Last Vitals:  Filed Vitals:   10/09/14 0930  BP: 111/54  Pulse: 89  Temp: 37.1 C  Resp: 8    Post-op Vital Signs: stable   Complications: No apparent anesthesia complications

## 2014-10-09 NOTE — Progress Notes (Signed)
No changes to H&P per patient history Reviewed with patient procedure-LAVH/BS/A&P repair/SSLS Patient states she understands and agrees, all questions answered Consent signed

## 2014-10-10 ENCOUNTER — Encounter (HOSPITAL_COMMUNITY): Payer: Self-pay | Admitting: Obstetrics and Gynecology

## 2014-10-10 DIAGNOSIS — N812 Incomplete uterovaginal prolapse: Secondary | ICD-10-CM | POA: Diagnosis not present

## 2014-10-10 LAB — CBC
HEMATOCRIT: 27.9 % — AB (ref 36.0–46.0)
Hemoglobin: 9.3 g/dL — ABNORMAL LOW (ref 12.0–15.0)
MCH: 30.6 pg (ref 26.0–34.0)
MCHC: 33.3 g/dL (ref 30.0–36.0)
MCV: 91.8 fL (ref 78.0–100.0)
PLATELETS: 183 10*3/uL (ref 150–400)
RBC: 3.04 MIL/uL — ABNORMAL LOW (ref 3.87–5.11)
RDW: 14.2 % (ref 11.5–15.5)
WBC: 10.2 10*3/uL (ref 4.0–10.5)

## 2014-10-10 MED ORDER — METHOCARBAMOL 500 MG PO TABS: 500.0000 mg | ORAL_TABLET | Freq: Four times a day (QID) | ORAL | Status: DC | PRN

## 2014-10-10 MED ORDER — OXYCODONE-ACETAMINOPHEN 5-325 MG PO TABS: 1.0000 | ORAL_TABLET | Freq: Four times a day (QID) | ORAL | Status: DC | PRN

## 2014-10-10 MED ORDER — IBUPROFEN 600 MG PO TABS: 600.0000 mg | ORAL_TABLET | Freq: Four times a day (QID) | ORAL | Status: DC | PRN

## 2014-10-10 MED ORDER — METHOCARBAMOL 500 MG PO TABS
500.0000 mg | ORAL_TABLET | Freq: Four times a day (QID) | ORAL | Status: DC | PRN
  Administered 2014-10-10: 500 mg via ORAL
  Filled 2014-10-10 (×2): qty 1

## 2014-10-10 NOTE — Progress Notes (Signed)
Patient discharged home with husband... Discharge instructions reviewed with patient and she verbalized understanding... Condition stable... No equipment... Ambulated to car with C. Riley, NT.  

## 2014-10-10 NOTE — Discharge Summary (Signed)
Physician Discharge Summary  Patient ID: Chelsea Fritz MRN: [id number] DOB/AGE: March 16, 1971 44 y.o.  Admit date: 10/09/2014 Discharge date: 10/10/2014  Admission Diagnoses:Pelvic Relaxation  Discharge Diagnoses:  Active Problems:   Pelvic relaxation   Discharged Condition: good  Hospital Course: good post operative recovery of bowel/bladder function, ambulating well, voiding.  Consults: None  Significant Diagnostic Studies: labs:  Results for orders placed or performed during the hospital encounter of 10/09/14 (from the past 72 hour(s))  Type and screen     Status: None   Collection Time: 10/09/14  6:15 AM  Result Value Ref Range   ABO/RH(D) O POS    Antibody Screen NEG    Sample Expiration 10/12/2014   CBC     Status: Abnormal   Collection Time: 10/10/14  5:22 AM  Result Value Ref Range   WBC 10.2 4.0 - 10.5 K/uL   RBC 3.04 (L) 3.87 - 5.11 MIL/uL   Hemoglobin 9.3 (L) 12.0 - 15.0 g/dL   HCT 40.927.9 (L) 81.136.0 - 91.446.0 %   MCV 91.8 78.0 - 100.0 fL   MCH 30.6 26.0 - 34.0 pg   MCHC 33.3 30.0 - 36.0 g/dL   RDW 78.214.2 95.611.5 - 21.315.5 %   Platelets 183 150 - 400 K/uL    Treatments: surgery: LAVH/BS/A&P repair/SSLS  Discharge Exam: Blood pressure 96/60, pulse 82, temperature 99.1 F (37.3 C), temperature source Oral, resp. rate 15, height 5' 1.25" (1.556 m), weight 149 lb (67.586 kg), SpO2 91 %. General appearance: alert, cooperative and no distress GI: soft, non-tender; bowel sounds normal; no masses,  no organomegaly  Disposition:      Medication List    STOP taking these medications        meloxicam 15 MG tablet  Commonly known as:  MOBIC     montelukast 10 MG tablet  Commonly known as:  SINGULAIR      TAKE these medications        budesonide 180 MCG/ACT inhaler  Commonly known as:  PULMICORT  Inhale 1 puff into the lungs daily.     fluticasone 50 MCG/ACT nasal spray  Commonly known as:  FLONASE  Place 1 spray into both nostrils daily.     ibuprofen 600 MG  tablet  Commonly known as:  ADVIL,MOTRIN  Take 1 tablet (600 mg total) by mouth every 6 (six) hours as needed (mild pain).     methocarbamol 500 MG tablet  Commonly known as:  ROBAXIN  Take 1 tablet (500 mg total) by mouth every 6 (six) hours as needed for muscle spasms.     oxyCODONE-acetaminophen 5-325 MG per tablet  Commonly known as:  PERCOCET/ROXICET  Take 1-2 tablets by mouth every 6 (six) hours as needed (moderate to severe pain (when tolerating fluids)).         Signed: Damek Ende II,Larayah Clute E 10/10/2014, 9:52 AM

## 2014-10-10 NOTE — Op Note (Signed)
Chelsea Fritz, Chelsea Fritz               ACCOUNT NO.:  [id number]  MEDICAL RECORD NO.:  [id number]  LOCATION:  9315                          FACILITY:  [facility]  PHYSICIAN:  Guy Sandifer. Henderson Cloud, M.D. DATE OF BIRTH:  March 09, 1971  DATE OF PROCEDURE:  10/09/2014 DATE OF DISCHARGE:                              OPERATIVE REPORT   PREOPERATIVE DIAGNOSIS:  Pelvic relaxation.  POSTOPERATIVE DIAGNOSIS:  Pelvic relaxation.  PROCEDURES:  Laparoscopically-assisted vaginal hysterectomy, bilateral salpingectomy, anterior-posterior vaginal repair and sacrospinous ligament suspension.  SURGEON:  Guy Sandifer. Henderson Cloud, M.D.  ASSISTANT:  Juluis Mire, M.D.  ANESTHESIA:  General endotracheal intubation.  ESTIMATED BLOOD LOSS:  300 mL.  SPECIMENS:  Uterus and bilateral fallopian tubes to Pathology.  INDICATIONS AND CONSENT:  This patient is a 44 year old multiparous patient with symptomatic pelvic relaxation.  Details are dictated in the history and physical.  LAVH, bilateral salpingectomy, A and P repair with sacrospinous ligament suspension is discussed preoperatively. Potential risks and complications were reviewed preoperatively including, but not limited to, infection, organ damage, bleeding requiring transfusion of blood products with HIV and hepatitis acquisition, DVT, PE, pneumonia, fistula formation, pelvic pain, abdominal pain, vulvar pain, painful intercourse, vaginal narrowing, fistula formation, laparotomy, return to the operating room and recurrent relaxation.  All questions have been answered.  The patient states she understands and agrees, and consent was signed on the chart.  FINDINGS:  Upper abdomen is grossly normal.  Uterus is about 6 weeks in size, smooth and contour.  Ovaries were status post ligation with Falope rings bilaterally and ovaries were normal.  Anterior and posterior cul- de-sacs were normal.  DESCRIPTION OF PROCEDURE:  The patient was taken to the operating room where she  was identified, placed in dorsal supine position, and general anesthesia was induced via endotracheal intubation.  She was placed in the dorsal lithotomy position.  She was prepped vaginally and abdominally.  Bladder was straight catheterized and a single-tooth tenaculum was placed on the anterior cervical lip.  An Acorn tenaculum placed in the cervix as a manipulator.  Time-out was undertaken.  She was then draped in a sterile fashion.  The infraumbilical and suprapubic areas were injected in the midline with approximately 6 mL of 0.5% plain Marcaine.  Small infraumbilical incision was made.  Disposable Veress needle was placed and a good syringe and drop test were noted.  Two liters of gas were then insufflated under low pressure with good tympany in the right upper quadrant.  Veress needle was removed and a 10/11 Xcel bladeless disposable trocar sleeve was placed using direct visualization with the diagnostic scope.  After placement, the operative scope was used.  A small suprapubic incision was made in the midline and a 5-mm disposable trocar sleeve was placed under direct visualization without difficulty.  The above findings were noted.  Then, using the EnSeal bipolar cautery cutting instrument, the right fallopian tube was taken down, this was carried across the proximal ligaments and down at the level of the vesicouterine peritoneum.  Similar procedure was carried out on the left.  Vesicouterine peritoneum was taken down cephalad laterally as well.  Suprapubic trocar sleeve was removed.  Instruments were removed and attention was  turned to the vagina.  Posterior cul-de- sac was entered sharply.  Cervix was circumscribed with unipolar cautery.  Mucosa was advanced sharply and bluntly.  Then using the handheld LigaSure bipolar cautery instrument, the uterosacral ligaments were taken down followed by the bladder pillars, cardinal ligaments, and uterine vessels bilaterally.  Anterior  cul-de-sac was also entered. Fundus was delivered posteriorly.  The remaining pedicles were taken down, the specimen was delivered.  All suture will be 0 Monocryl unless otherwise designated.  Sutures were placed at the 9 and 3 o'clock to aid in hemostasis.  The uterosacral ligaments were then plicated the cuff bilaterally with separate sutures and then plicated the midline with a third suture.  The posterior two-thirds of the cuff was closed with figure-of-eights.  Anterior vaginal repair was then done by first infiltrating the anterior vaginal mucosa with a dilute solution of 1% lidocaine with epinephrine diluted in 50 mL of saline.  Midline incision was then made in the mucosa and carried cephalad with the [employer] Scissors.  Dissection was carried out bilaterally, sharply and bluntly. Pursestring sutures were used to elevate the cystocele.  Small amount of mucosa was then removed and the anterior mucosa was closed in running locking fashion with 2-0 Monocryl suture.  The remainder of the anterior cuff was closed with figure-of-eight 0 Monocryl.  A small diamond-shaped wedge was then removed from the perineal body at the 6 o'clock position introitus.  Dissection was then carried out superiorly in the midline of the posterior vaginal mucosa after infiltrating with the same solution that was used anteriorly.  This was carried out about two-thirds of the distance of the posterior vaginal wall.  Sharp and blunt dissection were then used bilaterally to dissect the rectocele.  Further dissection identified the ischial spine and the sacrospinous ligament.  Then using the Capio needle driver with 0 Vicryl suture, it was placed at least 1 fingerbreadth medial to the spine through the sacrospinous ligament. This was then held for later.  The rectovaginal fascia was then reapproximated with interrupted 0 Monocryl suture.  Excess vaginal mucosa was trimmed and the upper one-half of the posterior  vaginal wall was then reapproximated with a running locking 2-0 Monocryl suture. This suture was then held while the sacrospinous stitch was then tied down.  This elevated the apex well.  It should be noted that the suture was also anchored to the posterior aspect of the vaginal apex prior to performing the posterior repair.  With the apex elevated, the remainder of the posterior vaginal mucosa was closed in a running, locking fashion.  Foley catheter was placed and clear urine was noted.  A 1-inch pack with estrogen cream was then placed in the vagina.  Attention was returned to the abdomen.  Inspection revealed minor oozing at peritoneal edges, which was easily controlled with bipolar cautery.  Inspection under reduced pneumoperitoneum revealed good hemostasis.  The remaining approximately 25 mL of 0.5% plain Marcaine was instilled into the peritoneal cavity.  Suprapubic trocar sleeve was removed, pneumoperitoneum was reduced and the umbilical trocar sleeve was removed.  Umbilical incision was closed with a 0 Vicryl in the fascia under good visualization and the skin was closed on both incisions with interrupted 2-0 Vicryl.  Dermabond was placed on both incisions.  All counts were correct and the patient was taken to the recovery room in stable condition.     Guy SandiferJames E. Henderson Cloudomblin, M.D.     JET/MEDQ  D:  10/09/2014  T:  10/10/2014  Job:  [id number]

## 2014-10-10 NOTE — Progress Notes (Signed)
POD #2 Rectal pain much better Tolerating regular diet, + flatus, voiding, ambulating Pack and foley out  VSS afeb Abdomen soft, BS good Incisions OK  Results for orders placed or performed during the hospital encounter of 10/09/14 (from the past 24 hour(s))  CBC     Status: Abnormal   Collection Time: 10/10/14  5:22 AM  Result Value Ref Range   WBC 10.2 4.0 - 10.5 K/uL   RBC 3.04 (L) 3.87 - 5.11 MIL/uL   Hemoglobin 9.3 (L) 12.0 - 15.0 g/dL   HCT 16.127.9 (L) 09.636.0 - 04.546.0 %   MCV 91.8 78.0 - 100.0 fL   MCH 30.6 26.0 - 34.0 pg   MCHC 33.3 30.0 - 36.0 g/dL   RDW 40.914.2 81.111.5 - 91.415.5 %   Platelets 183 150 - 400 K/uL   A/P: satisfactory         D/C home , ;instructions reviewed

## 2015-09-11 ENCOUNTER — Ambulatory Visit (INDEPENDENT_AMBULATORY_CARE_PROVIDER_SITE_OTHER): Payer: Worker's Compensation | Admitting: Rehabilitative and Restorative Service Providers"

## 2015-09-11 ENCOUNTER — Encounter: Payer: Self-pay | Admitting: Rehabilitative and Restorative Service Providers"

## 2015-09-11 DIAGNOSIS — R29898 Other symptoms and signs involving the musculoskeletal system: Secondary | ICD-10-CM

## 2015-09-11 DIAGNOSIS — M791 Myalgia, unspecified site: Secondary | ICD-10-CM

## 2015-09-11 DIAGNOSIS — R2689 Other abnormalities of gait and mobility: Secondary | ICD-10-CM

## 2015-09-11 DIAGNOSIS — R531 Weakness: Secondary | ICD-10-CM

## 2015-09-11 NOTE — Patient Instructions (Signed)
Abdominal Bracing With Pelvic Floor (Hook-Lying)    With neutral spine, tighten pelvic floor and abdominals sucking belly button to back bone; tighten muscles in low back at waist. Hold 10 sec Repeat __10_ times. Do _4-5 __ times a day.    Gluteal Sets    Tighten buttocks while pressing pelvis to floor. Hold _10___ seconds. Repeat __10__ times per set. Do __1-2__ sets per session. Do _2-3___ sessions per day. Can do this lying on back - hips and knees bent as needed     Bent Leg Lift (Hook-Lying)    Tighten stomach and slowly raise right leg ____ inches from floor. Keep trunk rigid. Hold __1-2__ seconds. Repeat __10__ times per set. Do __1-2__ sets per session. Do 2____ sessions per day.    Combination (Hook-Lying)    Tighten stomach and slowly raise left leg and lower opposite arm over head. Keep trunk rigid. Repeat __10__ times per set. Do __1-3__ sets per session. Do _2__ sessions per day.   TENS UNIT: This is helpful for muscle pain and spasm.   Search and Purchase a TENS 7000 2nd edition at www.tenspros.com. It should be less than $30.     TENS unit instructions: Do not shower or bathe with the unit on Turn the unit off before removing electrodes or batteries If the electrodes lose stickiness add a drop of water to the electrodes after they are disconnected from the unit and place on plastic sheet. If you continued to have difficulty, call the TENS unit company to purchase more electrodes. Do not apply lotion on the skin area prior to use. Make sure the skin is clean and dry as this will help prolong the life of the electrodes. After use, always check skin for unusual red areas, rash or other skin difficulties. If there are any skin problems, does not apply electrodes to the same area. Never remove the electrodes from the unit by pulling the wires. Do not use the TENS unit or electrodes other than as directed. Do not change electrode placement without  consultating your therapist or physician. Keep 2 fingers with between each electrode.  Sleeping on Back  Place pillow under knees. A pillow with cervical support and a roll around waist are also helpful. Copyright  VHI. All rights reserved.  Sleeping on Side Place pillow between knees. Use cervical support under neck and a roll around waist as needed. Copyright  VHI. All rights reserved.   Sleeping on Stomach   If this is the only desirable sleeping position, place pillow under lower legs, and under stomach or chest as needed.  Posture - Sitting   Sit upright, head facing forward. Try using a roll to support lower back. Keep shoulders relaxed, and avoid rounded back. Keep hips level with knees. Avoid crossing legs for long periods. Stand to Sit / Sit to Stand   To sit: Bend knees to lower self onto front edge of chair, then scoot back on seat. To stand: Reverse sequence by placing one foot forward, and scoot to front of seat. Use rocking motion to stand up.   Work Height and Reach  Ideal work height is no more than 2 to 4 inches below elbow level when standing, and at elbow level when sitting. Reaching should be limited to arm's length, with elbows slightly bent.  Bending  Bend at hips and knees, not back. Keep feet shoulder-width apart.    Posture - Standing   Good posture is important. Avoid slouching and forward head  thrust. Maintain curve in low back and align ears over shoul- ders, hips over ankles.  Alternating Positions   Alternate tasks and change positions frequently to reduce fatigue and muscle tension. Take rest breaks. Computer Work   Position work to Art gallery manager. Use proper work and seat height. Keep shoulders back and down, wrists straight, and elbows at right angles. Use chair that provides full back support. Add footrest and lumbar roll as needed.  Getting Into / Out of Car  Lower self onto seat, scoot back, then bring in one leg at a time. Reverse  sequence to get out.  Dressing  Lie on back to pull socks or slacks over feet, or sit and bend leg while keeping back straight.    Housework - Sink  Place one foot on ledge of cabinet under sink when standing at sink for prolonged periods.   Pushing / Pulling  Pushing is preferable to pulling. Keep back in proper alignment, and use leg muscles to do the work.  Deep Squat   Squat and lift with both arms held against upper trunk. Tighten stomach muscles without holding breath. Use smooth movements to avoid jerking.  Avoid Twisting   Avoid twisting or bending back. Pivot around using foot movements, and bend at knees if needed when reaching for articles.  Carrying Luggage   Distribute weight evenly on both sides. Use a cart whenever possible. Do not twist trunk. Move body as a unit.   Lifting Principles .Maintain proper posture and head alignment. .Slide object as close as possible before lifting. .Move obstacles out of the way. .Test before lifting; ask for help if too heavy. .Tighten stomach muscles without holding breath. .Use smooth movements; do not jerk. .Use legs to do the work, and pivot with feet. .Distribute the work load symmetrically and close to the center of trunk. .Push instead of pull whenever possible.   Ask For Help   Ask for help and delegate to others when possible. Coordinate your movements when lifting together, and maintain the low back curve.  Log Roll   Lying on back, bend left knee and place left arm across chest. Roll all in one movement to the right. Reverse to roll to the left. Always move as one unit. Housework - Sweeping  Use long-handled equipment to avoid stooping.   Housework - Wiping  Position yourself as close as possible to reach work surface. Avoid straining your back.  Laundry - Unloading Wash   To unload small items at bottom of washer, lift leg opposite to arm being used to reach.  Gardening - Raking  Move close  to area to be raked. Use arm movements to do the work. Keep back straight and avoid twisting.     Cart  When reaching into cart with one arm, lift opposite leg to keep back straight.   Getting Into / Out of Bed  Lower self to lie down on one side by raising legs and lowering head at the same time. Use arms to assist moving without twisting. Bend both knees to roll onto back if desired. To sit up, start from lying on side, and use same move-ments in reverse. Housework - Vacuuming  Hold the vacuum with arm held at side. Step back and forth to move it, keeping head up. Avoid twisting.   Laundry - Armed forces training and education officer so that bending and twisting can be avoided.   Laundry - Unloading Dryer  Squat down to reach into clothes dryer  or use a reacher.  Gardening - Weeding / Psychiatric nurse or Kneel. Knee pads may be helpful.

## 2015-09-11 NOTE — Therapy (Signed)
[facility] [place] [address] [place], [place], [address] Phone: [phone]   Fax:  [phone]  Physical Therapy Evaluation  Patient Details  Name: Chelsea Fritz MRN: [id number] Date of Birth: [birth year] Referring Provider: Dr. Venita Lick   Encounter Date: 09/11/2015      PT End of Session - 09/11/15 1650    Visit Number 1   Number of Visits 18   Date for PT Re-Evaluation 10/23/15   PT Start Time 1413   PT Stop Time 1521   PT Time Calculation (min) 68 min   Activity Tolerance Patient tolerated treatment well;Patient limited by pain      Past Medical History  Diagnosis Date  . Seasonal allergies   . Chronic bronchitis (HCC)   . History of kidney stones     passed stone - no surgery required  . Headache     otc med prn, sleep  . Anemia   . 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 SI (sacroiliac) pain     ablations done - SI joint - no anesthesia, darvocet only  . SVD (spontaneous vaginal delivery)     x 4    Past Surgical History  Procedure Laterality Date  . Wisdom tooth extraction    . Neck surgery      ? C4-C5  . Tubal ligation    . Dilitation & currettage/hystroscopy with novasure ablation    . Rotator cuff repair      left  . Knee surgery      x 2 - left  . Laparoscopic assisted vaginal hysterectomy N/A 10/09/2014    Procedure: LAPAROSCOPIC ASSISTED VAGINAL HYSTERECTOMY;  Surgeon: Harold Hedge, MD;  Location: [facility];  Service: Gynecology;  Laterality: N/A;  . Anterior and posterior repair with sacrospinous fixation N/A 10/09/2014    Procedure: ANTERIOR AND POSTERIOR REPAIR WITH SACROSPINOUS LIGAMENT SUSPENSION;  Surgeon: Harold Hedge, MD;  Location: [facility];  Service: Gynecology;  Laterality: N/A;  . Bilateral salpingectomy Bilateral 10/09/2014    Procedure: BILATERAL SALPINGECTOMY;  Surgeon: Harold Hedge, MD;  Location: [facility];  Service: Gynecology;  Laterality: Bilateral;     There were no vitals filed for this visit.       Subjective Assessment - 09/11/15 1416    Subjective Chelsea Fritz reporets that she had a Lt SI joint fusion 07/15/15 and is now released to begin rehab. Patient reports that she fell down a hill 08/29/10 and sustained injury to Lt LB. She was treated with PT and conservative care including injections and ablasions with only temporary improvement.  Post SI fusion pt was NWB  until 08/27/15 - now wt bearing as tolerated.    Pertinent History HNP L5/S1; injury to cervical spine with disc replacement ? level surgery 2/13; injury to Lt knee resolved without intervention; torn tendons in Lt ankle; ablasions in the Rt SI    How long can you sit comfortably? 15-20 min    How long can you stand comfortably? 5-10  min    How long can you walk comfortably? 2-5 min    Diagnostic tests xrays; MRI    Currently in Pain? Yes   Pain Score 6    Pain Location Back   Pain Orientation Lower;Left   Pain Descriptors / Indicators Aching   Pain Radiating Towards Lt anterior thigh area - sharp    Pain Onset More than a month ago   Pain Frequency Intermittent   Aggravating Factors  LB - sititng or standing  ; hip area - sitting or standing   Pain Relieving Factors lying down on fack             Chelsea Fritz    Assessment   Medical Diagnosis SI dysfunction    Referring Provider Dr. Venita Lickahari Brooks    Onset Date/Surgical Date 07/15/10   Hand Dominance Left   Next MD Visit 10/08/15   Prior Therapy yes    Precautions   Precautions None   Precaution Comments avoid overdoing activity level   Restrictions   Other Position/Activity Restrictions WBAT - will gradually increase WB    Balance Screen   Has the patient fallen in the past 6 months No   Has the patient had a decrease in activity level because of a fear of falling?  No   Is the patient reluctant to leave their home because of a fear of falling?  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    Leisure houseold chores - 4 children 8; 6712; 3514; 45  years old    Observation/Other Assessments   Focus on Therapeutic Outcomes (FOTO)  72% limitation    Sensation   Additional Comments numbness intermittently in Lt foot now whole foot, was lateral 3 toes    Posture/Postural Control   Posture Comments weight shifted to the Rt; flexed forward at hips; head forward; shoulders rounded and elevated   AROM   Overall AROM Comments limited Lt hip mobilty at early/mid/end ranges throughout(not fully assessed) tight and painful throughout; Rt hip - end range limitations  greater mobilty noted that the Lt    Strength   Overall Strength Comments Rt LE grossly WFL's; Lt not tested through hip due to pain Lt knee flex 4-/5; knee ext 4/5    Flexibility   Hamstrings Rt tight ~ 65 deg with LBP; Lt tight ~ 45 deg with LBP    Quadriceps Rt tight 110 deg; Lt 90 deg prone heel toward buttock   ITB tight bilat    Piriformis tight bilat    Palpation   SI assessment  not assessed    Palpation comment tenderthrough sacrum; Lt > Rt SI area; Lt posterior hip musculature into the Lt hamstrings and IT band    Special Tests    Special Tests --  SLR produces increased LBP - no radicular pain    Ambulation/Gait   Gait Comments ambulates with 1 axillary crutch Rt UE - weight shifted to the Rt with trunk shifted to the Rt as well ; decreased WB Lt LE                    [facility] Adult PT Treatment/Exercise - 09/11/15 Fritz    Ambulation/Gait   Ambulation/Gait --  gait training with 3 point gait single axillary crutch    Lumbar Exercises: Supine   Ab Set --  3 part core 10 sec x 10    Glut Set 10 reps  10 sec supine with hips extended x 5 reps; hips flexed  x5   Bent Knee Raise 10 reps   Dead Bug 10 reps   Moist Heat Therapy   Number Minutes Moist Heat 20 Minutes  Moist Heat Location --  hamstrings bilat    Cryotherapy   Number Minutes Cryotherapy 20 Minutes   Cryotherapy Location Lumbar Spine  SI bilat    Type of Cryotherapy Ice pack   Electrical Stimulation   Electrical Stimulation Location bilat lumbar spine; Lt hip    Electrical Stimulation Action IFC   Electrical Stimulation Parameters to tolerance   Electrical Stimulation Goals Pain;Tone                PT Education - 09/11/15 1500    Education provided Yes   Education Details HEP TENS posture/body mechanics    Person(s) Educated Patient   Methods Explanation;Tactile cues;Demonstration;Verbal cues;Handout   Comprehension Verbalized understanding;Returned demonstration;Verbal cues required;Tactile cues required             PT Long Term Goals - 09/11/15 1701    PT LONG TERM GOAL #1   Title Improve trunk and LE mobility to functional level 10/22/15   Time 6   Period Weeks   Status New   PT LONG TERM GOAL #2   Title Normal gait pattern witoht assistive device and minimal pain 10/22/15   Time 6   Period Weeks   Status New   PT LONG TERM GOAL #3   Title Improve functional activity level with patient to tolerate 30 min of sitting; 15 min of standing and 5-10 min of walking 10/22/15   Time 6   Period Weeks   Status New   PT LONG TERM GOAL #4   Title Independent in HEP 10/22/15   Time 6   Period Weeks   Status New   PT LONG TERM GOAL #5   Title Improve FOTO to </= 49% limitation 10/22/15   Time 6   Period Weeks   Status New               Plan - 09/11/15 1651    Clinical Impression Statement Chelsea Fritz presents with pain and limited funcitonal activity level following Lt SI fusion 07/15/15. She has LB/sacral pain as well as pain into the Rt SI  and tightness and pain into the Lt hamstring and calf. She limited trunk and LE mobilty. Strength testing was  deferred due to pain. Paitent has pain with palpation through the LB and bilat hips asa well as the hip flexors anteriorly. Patient has abnormal gait pattern with decreased weight bearing through Lt LE and poor gait pattern with single  axillary crutch    Rehab Potential Good   PT Frequency 3x / week   PT Duration 6 weeks   PT Treatment/Interventions Patient/family education;Cryotherapy;Electrical Stimulation;Iontophoresis 4mg /ml Dexamethasone;Moist Heat;Ultrasound;Neuromuscular re-education;Manual techniques;Dry needling;Therapeutic activities;Therapeutic exercise   PT Next Visit Plan gentle progression of core stabilization and movement; gait training; modalities and manual treatment as indicated   Consulted and Agree with Plan of Care Patient      Patient will benefit from skilled therapeutic intervention in order to improve the following deficits and impairments:  Postural dysfunction, Improper body mechanics, Pain, Increased fascial restricitons, Increased muscle spasms, Abnormal gait, Decreased strength, Decreased endurance, Decreased activity tolerance  Visit Diagnosis: Myalgia - Plan: PT plan of care cert/re-cert  Other symptoms and signs involving the musculoskeletal system - Plan: PT plan of care cert/re-cert  Weakness generalized - Plan: PT plan of care cert/re-cert  Other abnormalities of gait and mobility - Plan: PT plan of care cert/re-cert     Problem List Patient Active Problem List   Diagnosis Date Noted  . Pelvic relaxation 10/09/2014  Carry Weesner Rober Minion PT, MPH  09/11/2015, 5:07 PM  [facility] [address] [place], [place], [address] Phone: [phone]   Fax:  [phone]  Name: Chelsea Fritz MRN: [id number] Date of Birth: 03/25/1971

## 2015-09-17 ENCOUNTER — Encounter: Payer: Self-pay | Admitting: Rehabilitative and Restorative Service Providers"

## 2015-09-17 ENCOUNTER — Ambulatory Visit (INDEPENDENT_AMBULATORY_CARE_PROVIDER_SITE_OTHER): Payer: Worker's Compensation | Admitting: Rehabilitative and Restorative Service Providers"

## 2015-09-17 DIAGNOSIS — R2689 Other abnormalities of gait and mobility: Secondary | ICD-10-CM

## 2015-09-17 DIAGNOSIS — M791 Myalgia, unspecified site: Secondary | ICD-10-CM

## 2015-09-17 DIAGNOSIS — R531 Weakness: Secondary | ICD-10-CM

## 2015-09-17 DIAGNOSIS — R29898 Other symptoms and signs involving the musculoskeletal system: Secondary | ICD-10-CM

## 2015-09-17 NOTE — Patient Instructions (Signed)
HIP: Hamstrings - Supine    Place strap around foot. Raise leg up, keep knee straight. Hold _30__ seconds. __3_ reps per set, __3-4 times per day   Self massage using a ~ 4 inch rubber ball

## 2015-09-17 NOTE — Therapy (Signed)
[facility] [address] [place], [place], [address] Phone: [phone]   Fax:  [phone]  Physical Therapy Treatment  Patient Details  Name: Uvaldo Risingara Hatcher MRN: [id number] Date of Birth: 1970-08-27 Referring Provider: Dr. Venita Lickahari Brooks   Encounter Date: 09/17/2015      PT End of Session - 09/17/15 1103    Visit Number 2   Number of Visits 18   Date for PT Re-Evaluation 10/23/15   PT Start Time 1103   PT Stop Time 1200   PT Time Calculation (min) 57 min   Activity Tolerance Patient tolerated treatment well      Past Medical History  Diagnosis Date  . Seasonal allergies   . Chronic bronchitis (HCC)   . History of kidney stones     passed stone - no surgery required  . Headache     otc med prn, sleep  . Anemia   . 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 SI (sacroiliac) pain     ablations done - SI joint - no anesthesia, darvocet only  . SVD (spontaneous vaginal delivery)     x 4    Past Surgical History  Procedure Laterality Date  . Wisdom tooth extraction    . Neck surgery      ? C4-C5  . Tubal ligation    . Dilitation & currettage/hystroscopy with novasure ablation    . Rotator cuff repair      left  . Knee surgery      x 2 - left  . Laparoscopic assisted vaginal hysterectomy N/A 10/09/2014    Procedure: LAPAROSCOPIC ASSISTED VAGINAL HYSTERECTOMY;  Surgeon: Harold HedgeJames Tomblin, MD;  Location: [facility];  Service: Gynecology;  Laterality: N/A;  . Anterior and posterior repair with sacrospinous fixation N/A 10/09/2014    Procedure: ANTERIOR AND POSTERIOR REPAIR WITH SACROSPINOUS LIGAMENT SUSPENSION;  Surgeon: Harold HedgeJames Tomblin, MD;  Location: [facility];  Service: Gynecology;  Laterality: N/A;  . Bilateral salpingectomy Bilateral 10/09/2014    Procedure: BILATERAL SALPINGECTOMY;  Surgeon: Harold HedgeJames Tomblin, MD;  Location: [facility];  Service: Gynecology;  Laterality: Bilateral;    There were no vitals  filed for this visit.      Subjective Assessment - 09/17/15 1103    Subjective Significant pain after the evaluation last week. She has calmed down some since then and is doing OK with home exercise. She forgot to mention that she had a complete hysterectomy with reconstruction of vagina and rectum  last year.  She ihas muscle spasm and tightness through the hamstrings and hip flexors on the Lt - especially in the hamstrings - also notes that she has tightness in the calf. Hamstrings and Lt LE feel much better after treatment today.    Currently in Pain? Yes   Pain Score 6    Pain Location Hip   Pain Orientation Lower;Left   Pain Descriptors / Indicators Tightness;Aching   Pain Type Chronic pain   Pain Radiating Towards Lt anterior thigh and hp as well as through the hamstrings    Pain Onset More than a month ago   Pain Frequency Intermittent                         [facility] Adult PT Treatment/Exercise - 09/17/15 0001    Therapeutic Activites    Therapeutic Activities --  instruction - myofacial ball release Lt ant/post hip/HS    Lumbar Exercises: Stretches  Passive Hamstring Stretch 3 reps;30 seconds   Passive Hamstring Stretch Limitations PT assist to maintain good pelvic alignment    Lumbar Exercises: Standing   Other Standing Lumbar Exercises wt shift standing at counter - combined with ball release work anterior hip/flexors    Lumbar Exercises: Supine   Ab Set --  3 part core 10 sec x 10    Glut Set 10 reps  10 sec supine with hips extended x 5 reps; hips flexed x5   Bent Knee Raise 20 reps   Dead Bug 20 reps   Moist Heat Therapy   Number Minutes Moist Heat 20 Minutes   Moist Heat Location --  hamstrings bilat    Cryotherapy   Number Minutes Cryotherapy 20 Minutes   Cryotherapy Location Lumbar Spine  SI bilat    Type of Cryotherapy Ice pack   Electrical Stimulation   Electrical Stimulation Location bilat lumbar spine; Lt hip    Electrical Stimulation  Action IFC   Electrical Stimulation Parameters to tolerance    Electrical Stimulation Goals Pain;Tone   Manual Therapy   Manual therapy comments patient supine for hip flexor and quad work; prone for posterior hip and hamstring/calf work    Soft tissue mobilization working through [employer] hip flexors; posterior hip; hamstrings; calf minimal to moderate pressure    Myofascial Release Lt hip/ calf                 PT Education - 09/17/15 1155    Education provided Yes   Education Details HEP myofacial ball release work    Teacher, music) Educated Patient   Methods Explanation;Demonstration;Tactile cues;Verbal cues;Handout   Comprehension Verbalized understanding;Returned demonstration;Verbal cues required;Tactile cues required             PT Long Term Goals - 09/17/15 1244    PT LONG TERM GOAL #1   Title Improve trunk and LE mobility to functional level 10/22/15   Time 6   Period Weeks   Status On-going   PT LONG TERM GOAL #2   Title Normal gait pattern witoht assistive device and minimal pain 10/22/15   Time 6   Period Weeks   Status On-going   PT LONG TERM GOAL #3   Title Improve functional activity level with patient to tolerate 30 min of sitting; 15 min of standing and 5-10 min of walking 10/22/15   Time 6   Period Weeks   Status On-going   PT LONG TERM GOAL #4   Title Independent in HEP 10/22/15   Time 6   Period Weeks   Status On-going   PT LONG TERM GOAL #5   Title Improve FOTO to </= 49% limitation 10/22/15   Time 6   Period Weeks   Status On-going               Plan - 09/17/15 1245    Clinical Impression Statement [name] reports significant flare up of pain the day following initial evaluation which has calmed down now. She is tolerating her exercises OK. Patient is having a lot of tightness and spasms in the Lt hamstrings and tightness through the Lt hip flexors. She responded well to the manual work; ball release work and moist heat. Gait pattern is improved  with one axilary crutch today. Anticipate slow progress due to complex nature of multiple injuries and Lt  SI fusion.     Rehab Potential Good   PT Frequency 3x / week   PT Duration 6 weeks  PT Treatment/Interventions Patient/family education;Cryotherapy;Electrical Stimulation;Iontophoresis /ml Dexamethasone;Moist Heat;Ultrasound;Neuromuscular re-education;Manual techniques;Dry needling;Therapeutic activities;Therapeutic exercise   PT Next Visit Plan gentle progression of core stabilization and movement; gait training; modalities and manual treatment as indicated. Discussed TDN - patient wants to see how she does with the other techniques/options first. Continue manual work and modalities to address myofacial restrictions. Add calf stretch    Consulted and Agree with Plan of Care Patient      Patient will benefit from skilled therapeutic intervention in order to improve the following deficits and impairments:  Postural dysfunction, Improper body mechanics, Pain, Increased fascial restricitons, Increased muscle spasms, Abnormal gait, Decreased strength, Decreased endurance, Decreased activity tolerance  Visit Diagnosis: Myalgia  Other symptoms and signs involving the musculoskeletal system  Weakness generalized  Other abnormalities of gait and mobility     Problem List Patient Active Problem List   Diagnosis Date Noted  . Pelvic relaxation 10/09/2014    Cornelious Diven Rober Minion PT, MPH  09/17/2015, 1:00 PM  [facility] [address] [place], [place], [address] Phone: [phone]   Fax:  [phone]  Name: Lillith Mcneff MRN: [id number] Date of Birth: [birth year]

## 2015-09-19 ENCOUNTER — Ambulatory Visit (INDEPENDENT_AMBULATORY_CARE_PROVIDER_SITE_OTHER): Payer: Worker's Compensation | Admitting: Physical Therapy

## 2015-09-19 DIAGNOSIS — R531 Weakness: Secondary | ICD-10-CM

## 2015-09-19 DIAGNOSIS — M791 Myalgia, unspecified site: Secondary | ICD-10-CM

## 2015-09-19 DIAGNOSIS — R2689 Other abnormalities of gait and mobility: Secondary | ICD-10-CM

## 2015-09-19 DIAGNOSIS — R29898 Other symptoms and signs involving the musculoskeletal system: Secondary | ICD-10-CM

## 2015-09-19 NOTE — Therapy (Signed)
[place], [place], [address] Phone: [phone]   Fax:  [phone]  Physical Therapy Treatment  Patient Details  Name: Chelsea Fritz MRN: [id number] Date of Birth: 10-21-1970 Referring Provider: Dr. Melina Schools   Encounter Date: 09/19/2015      PT End of Session - 09/19/15 1113    Visit Number 3   Number of Visits 18   Date for PT Re-Evaluation 10/23/15   PT Start Time 1105  pt arrived late   PT Stop Time 1204   PT Time Calculation (min) 59 min      Past Medical History  Diagnosis Date  . Seasonal allergies   . Chronic bronchitis ([place])   . History of kidney stones     passed stone - no surgery required  . Headache     otc med prn, sleep  . Anemia   . 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 SI (sacroiliac) pain     ablations done - SI joint - no anesthesia, darvocet only  . SVD (spontaneous vaginal delivery)     x 4    Past Surgical History  Procedure Laterality Date  . Wisdom tooth extraction    . Neck surgery      ? C4-C5  . Tubal ligation    . Dilitation & currettage/hystroscopy with novasure ablation    . Rotator cuff repair      left  . Knee surgery      x 2 - left  . Laparoscopic assisted vaginal hysterectomy N/A 10/09/2014    Procedure: LAPAROSCOPIC ASSISTED VAGINAL HYSTERECTOMY;  Surgeon: Everlene Farrier, MD;  Location: [facility];  Service: Gynecology;  Laterality: N/A;  . Anterior and posterior repair with sacrospinous fixation N/A 10/09/2014    Procedure: ANTERIOR AND POSTERIOR REPAIR WITH SACROSPINOUS LIGAMENT SUSPENSION;  Surgeon: Everlene Farrier, MD;  Location: [facility];  Service: Gynecology;  Laterality: N/A;  . Bilateral salpingectomy Bilateral 10/09/2014    Procedure: BILATERAL SALPINGECTOMY;  Surgeon: Everlene Farrier, MD;  Location: [facility];  Service: Gynecology;  Laterality: Bilateral;    There were no vitals filed for this visit.       Subjective Assessment - 09/19/15 1113    Subjective Pt reports she felt great during last session, but that night she had increased pain in LLE.  Tightness in Lt hamstring felt worse, tried ice and took muscle relaxer.     Currently in Pain? Yes   Pain Score 6    Pain Location Hip   Pain Orientation Left;Lower            [facility] PT Assessment - 09/19/15 0001    Assessment   Medical Diagnosis SI dysfunction    Referring Provider Dr. Melina Schools    Onset Date/Surgical Date 07/15/10   Hand Dominance Left   Next MD Visit 10/08/15   Prior Therapy yes    Precautions   Precaution Comments avoid overdoing activity level   Restrictions   Other Position/Activity Restrictions WBAT - will gradually increase WB    Flexibility   Hamstrings Lt 62 deg,  Rt 105 deg    Quadriceps --  pt unable to tolerate Lt knee flex >90 due to back pain                     [facility] Adult PT Treatment/Exercise - 09/19/15 0001    Self-Care   Self-Care Scar Mobilizations   Scar  Mobilizations Educated pt on how to desensitize scar with self-massage.  Pt verbalized    Lumbar Exercises: Stretches   Passive Hamstring Stretch 3 reps;30 seconds   Quad Stretch 3 reps;30 seconds  LLE   Quad Stretch Limitations unable to pull past 90 due to LBP.    Lumbar Exercises: Aerobic   Stationary Bike NuStep L4: 5.5 min    Lumbar Exercises: Standing   Other Standing Lumbar Exercises weight shifts side to side, front to back,  with Lt foot on scale.  pt applied up to 50% weight through LLE.      Lumbar Exercises: Seated   Sit to Stand 5 reps  with core engaged.    Lumbar Exercises: Supine   Ab Set 10 reps;5 seconds   Moist Heat Therapy   Number Minutes Moist Heat 15 Minutes   Moist Heat Location --  hamstrings LLE   Cryotherapy   Number Minutes Cryotherapy 15 Minutes   Cryotherapy Location Lumbar Spine  SI bilat    Type of Cryotherapy Ice pack   Electrical Stimulation   Electrical Stimulation Location  bilat lumbar spine; Lt hip    Electrical Stimulation Action IFC   Electrical Stimulation Parameters to tolerance    Electrical Stimulation Goals Tone;Pain   Manual Therapy   Manual therapy comments patient prone for posterior hip and hamstring/calf work    Soft tissue mobilization TPR and soft tissue mobilization to Lt hamstring, adductor and calf.                 PT Education - 09/19/15 1649    Education provided Yes   Education Details scar massage.    Person(s) Educated Patient   Methods Explanation   Comprehension Verbalized understanding             PT Long Term Goals - 09/17/15 1244    PT LONG TERM GOAL #1   Title Improve trunk and LE mobility to functional level 10/22/15   Time 6   Period Weeks   Status On-going   PT LONG TERM GOAL #2   Title Normal gait pattern witoht assistive device and minimal pain 10/22/15   Time 6   Period Weeks   Status On-going   PT LONG TERM GOAL #3   Title Improve functional activity level with patient to tolerate 30 min of sitting; 15 min of standing and 5-10 min of walking 10/22/15   Time 6   Period Weeks   Status On-going   PT LONG TERM GOAL #4   Title Independent in HEP 10/22/15   Time 6   Period Weeks   Status On-going   PT LONG TERM GOAL #5   Title Improve FOTO to </= 49% limitation 10/22/15   Time 6   Period Weeks   Status On-going          Plan - 09/19/15 1217    Clinical Impression Statement Pt reported mild to moderate increase in pain with attempts to stretch Lt quad in prone.  Pt tolerated standing weight shifts and NuStep with minimal increase in pain.  Pt demonstrated improved hamstring flexibility this date.  Lt hamstring pain and tightness (very point tender to touch) continues to be barrier.  No goals  met yet.    Rehab Potential Good   PT Frequency 3x / week   PT Duration 6 weeks   PT Treatment/Interventions Patient/family education;Cryotherapy;Electrical Stimulation;Iontophoresis 64m/ml  Dexamethasone;Moist Heat;Ultrasound;Neuromuscular re-education;Manual techniques;Dry needling;Therapeutic activities;Therapeutic exercise   PT Next Visit Plan gentle progression of  core stabilization and movement; gait training; modalities and manual treatment as indicated. TDN as indicated. Add calf stretch to HEP.    Consulted and Agree with Plan of Care Patient      Patient will benefit from skilled therapeutic intervention in order to improve the following deficits and impairments:  Postural dysfunction, Improper body mechanics, Pain, Increased fascial restricitons, Increased muscle spasms, Abnormal gait, Decreased strength, Decreased endurance, Decreased activity tolerance  Visit Diagnosis: Myalgia  Other symptoms and signs involving the musculoskeletal system  Weakness generalized  Other abnormalities of gait and mobility     Problem List Patient Active Problem List   Diagnosis Date Noted  . Pelvic relaxation 10/09/2014   Kerin Perna, PTA 09/19/2015 4:55 PM  [place], [place], [address] Phone: [phone]   Fax:  [phone]  Name: Chelsea Fritz MRN: [id number] Date of Birth: Jul 11, 1970

## 2015-09-24 ENCOUNTER — Ambulatory Visit (INDEPENDENT_AMBULATORY_CARE_PROVIDER_SITE_OTHER): Payer: Worker's Compensation | Admitting: Physical Therapy

## 2015-09-24 DIAGNOSIS — M791 Myalgia, unspecified site: Secondary | ICD-10-CM

## 2015-09-24 DIAGNOSIS — R29898 Other symptoms and signs involving the musculoskeletal system: Secondary | ICD-10-CM

## 2015-09-24 DIAGNOSIS — R531 Weakness: Secondary | ICD-10-CM

## 2015-09-24 DIAGNOSIS — R2689 Other abnormalities of gait and mobility: Secondary | ICD-10-CM

## 2015-09-24 NOTE — Therapy (Signed)
[facility] [place] [address] [place], [place], [address] Phone: [phone]   Fax:  [phone]  Physical Therapy Treatment  Patient Details  Name: Chelsea Fritz MRN: [id number] Date of Birth: June 20, 1970 Referring Provider: Dr. Venita Lick   Encounter Date: 09/24/2015      PT End of Session - 09/24/15 1113    Visit Number 4   Number of Visits 18   Date for PT Re-Evaluation 10/23/15   PT Start Time 1105   PT Stop Time 1220   PT Time Calculation (min) 75 min   Activity Tolerance Patient tolerated treatment well      Past Medical History  Diagnosis Date  . Seasonal allergies   . Chronic bronchitis (HCC)   . History of kidney stones     passed stone - no surgery required  . Headache     otc med prn, sleep  . Anemia   . 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 SI (sacroiliac) pain     ablations done - SI joint - no anesthesia, darvocet only  . SVD (spontaneous vaginal delivery)     x 4    Past Surgical History  Procedure Laterality Date  . Wisdom tooth extraction    . Neck surgery      ? C4-C5  . Tubal ligation    . Dilitation & currettage/hystroscopy with novasure ablation    . Rotator cuff repair      left  . Knee surgery      x 2 - left  . Laparoscopic assisted vaginal hysterectomy N/A 10/09/2014    Procedure: LAPAROSCOPIC ASSISTED VAGINAL HYSTERECTOMY;  Surgeon: Harold Hedge, MD;  Location: [facility];  Service: Gynecology;  Laterality: N/A;  . Anterior and posterior repair with sacrospinous fixation N/A 10/09/2014    Procedure: ANTERIOR AND POSTERIOR REPAIR WITH SACROSPINOUS LIGAMENT SUSPENSION;  Surgeon: Harold Hedge, MD;  Location: [facility];  Service: Gynecology;  Laterality: N/A;  . Bilateral salpingectomy Bilateral 10/09/2014    Procedure: BILATERAL SALPINGECTOMY;  Surgeon: Harold Hedge, MD;  Location: [facility];  Service: Gynecology;  Laterality: Bilateral;    There were no vitals  filed for this visit.      Subjective Assessment - 09/24/15 1116    Subjective Pt reports she had increased pain in LLE and back for 2 days following last treatment. Spasming in Lt hamstring has been a problem.  She used ice and muscle relaxers to reduce pain.  She has been working on weight shifts and self massage, and has noticed slight improvement.  Pt has goal to get rid of crutch by July 11 (MD appt) if possible.   She would like to return to work Aug 1, and "be stable as much as possible" for beach trip last wk of July.  Pt states she is interested in TDN next session.    Pertinent History HNP L5/S1; injury to cervical spine with disc replacement ? level surgery 2/13; injury to Lt knee resolved without intervention; torn tendons in Lt ankle; ablasions in the Rt SI    Currently in Pain? Yes   Pain Score 5    Pain Location Hip   Pain Orientation Left;Anterior;Posterior   Pain Descriptors / Indicators Dull   Aggravating Factors  sitting or standing   Pain Relieving Factors lying down on back, ice/ heat.             [facility] PT Assessment - 09/24/15 0001  Assessment   Medical Diagnosis SI dysfunction    Onset Date/Surgical Date 07/15/10   Hand Dominance Left   Next MD Visit 10/08/15   Prior Therapy yes           [facility] Adult PT Treatment/Exercise - 09/24/15 0001    Exercises   Exercises Knee/Hip;Lumbar   Lumbar Exercises: Stretches   Passive Hamstring Stretch 3 reps;30 seconds   Quad Stretch 3 reps;20 seconds  in sidelying    Lumbar Exercises: Aerobic   Stationary Bike NuStep L3: 7 min    Lumbar Exercises: Standing   Other Standing Lumbar Exercises weight shifts side to side, front to back, back to front,  with Lt foot on scale.  pt applied up to 80% weight through LLE. (up to 130#- pt weighs 164#)     Seated Rest breaks every 5-10 weight shifts.    Other Standing Lumbar Exercises TKE LLE with green band x 10 reps (wt on RLE)   Knee/Hip Exercises: Stretches   Gastroc  Stretch Left;2 reps;30 seconds  supine with strap   Knee/Hip Exercises: Standing   Other Standing Knee Exercises ascending/descending 3" steps with BUE support x 5 reps LLE    Modalities   Modalities Ultrasound;Electrical Stimulation;Moist Heat;Cryotherapy   Moist Heat Therapy   Number Minutes Moist Heat 15 Minutes   Moist Heat Location Hip   Cryotherapy   Number Minutes Cryotherapy 15 Minutes   Cryotherapy Location Lumbar Spine   Type of Cryotherapy Ice pack   Electrical Stimulation   Electrical Stimulation Location bilat SI joint,   ant / post Lt thigh   Electrical Stimulation Action premod to each area   Electrical Stimulation Parameters to tolerance    Electrical Stimulation Goals Tone;Pain   Ultrasound   Ultrasound Location Lt proximal lateral quad    Ultrasound Parameters 100%, 1.2 w/cm2, 8 min   Ultrasound Goals Pain;Other (Comment)  tightness.    Manual Therapy   Manual Therapy Soft tissue mobilization;Taping   Manual therapy comments Rock tape applied to Lt proximal to mid hamstring and proximal quad to decompress tissue, decrease sensitivity, and decrease pain.    Soft tissue mobilization to Lt ant proximal thigh (gentle pressure)           PT Long Term Goals - 09/17/15 1244    PT LONG TERM GOAL #1   Title Improve trunk and LE mobility to functional level 10/22/15   Time 6   Period Weeks   Status On-going   PT LONG TERM GOAL #2   Title Normal gait pattern witoht assistive device and minimal pain 10/22/15   Time 6   Period Weeks   Status On-going   PT LONG TERM GOAL #3   Title Improve functional activity level with patient to tolerate 30 min of sitting; 15 min of standing and 5-10 min of walking 10/22/15   Time 6   Period Weeks   Status On-going   PT LONG TERM GOAL #4   Title Independent in HEP 10/22/15   Time 6   Period Weeks   Status On-going   PT LONG TERM GOAL #5   Title Improve FOTO to </= 49% limitation 10/22/15   Time 6   Period Weeks   Status  On-going               Plan - 09/24/15 1213    Clinical Impression Statement Pt able to tolerate Rt sidelying Lt quad stretch better than supine.  Pt able to tolerate up to  80% WB through LLE in standing this visit.  Pt reported decrease in pain in Lt hamstring and ant hip at end of treatment, however pain in SI joint (bilat) persisted.  Pt gradually progressing towards goals.    Rehab Potential Good   PT Frequency 3x / week   PT Duration 6 weeks   PT Treatment/Interventions Patient/family education;Cryotherapy;Electrical Stimulation;Iontophoresis 4mg /ml Dexamethasone;Moist Heat;Ultrasound;Neuromuscular re-education;Manual techniques;Dry needling;Therapeutic activities;Therapeutic exercise   PT Next Visit Plan gentle progression of core stabilization and movement; gait training; modalities and manual treatment as indicated. TDN as indicated. Assess response to [facility]/ US.    Consulted and Agree with Plan of Care Patient      Patient will benefit from skilled therapeutic intervention in order to improve the following deficits and impairments:  Postural dysfunction, Improper body mechanics, Pain, Increased fascial restricitons, Increased muscle spasms, Abnormal gait, Decreased strength, Decreased endurance, Decreased activity tolerance  Visit Diagnosis: Myalgia  Other symptoms and signs involving the musculoskeletal system  Weakness generalized  Other abnormalities of gait and mobility     Problem List Patient Active Problem List   Diagnosis Date Noted  . Pelvic relaxation 10/09/2014   Mayer CamelJennifer Carlson-Long, PTA 09/24/2015 2:19 PM  [facility] [address] [place], [place], [address] Phone: [phone]   Fax:  [phone]  Name: Uvaldo Risingara Exline MRN: [id number] Date of Birth: Nov 06, 1970

## 2015-09-26 ENCOUNTER — Ambulatory Visit (INDEPENDENT_AMBULATORY_CARE_PROVIDER_SITE_OTHER): Payer: Worker's Compensation | Admitting: Physical Therapy

## 2015-09-26 DIAGNOSIS — M791 Myalgia, unspecified site: Secondary | ICD-10-CM

## 2015-09-26 DIAGNOSIS — R531 Weakness: Secondary | ICD-10-CM

## 2015-09-26 DIAGNOSIS — R2689 Other abnormalities of gait and mobility: Secondary | ICD-10-CM

## 2015-09-26 DIAGNOSIS — R29898 Other symptoms and signs involving the musculoskeletal system: Secondary | ICD-10-CM

## 2015-09-26 NOTE — Therapy (Signed)
[facility] [address] [place], [place], [address] Phone: [phone]   Fax:  [phone]  Physical Therapy Treatment  Patient Details  Name: Chelsea Fritz MRN: [id number] Date of Birth: [birth year] Referring Provider: Dr. Venita Lickahari Brooks   Encounter Date: 09/26/2015      PT End of Session - 09/26/15 1121    Visit Number 5   Number of Visits 18   Date for PT Re-Evaluation 10/23/15   PT Start Time 1100   PT Stop Time 1159   PT Time Calculation (min) 59 min   Activity Tolerance Patient tolerated treatment well      Past Medical History  Diagnosis Date  . Seasonal allergies   . Chronic bronchitis (HCC)   . History of kidney stones     passed stone - no surgery required  . Headache     otc med prn, sleep  . Anemia   . 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 SI (sacroiliac) pain     ablations done - SI joint - no anesthesia, darvocet only  . SVD (spontaneous vaginal delivery)     x 4    Past Surgical History  Procedure Laterality Date  . Wisdom tooth extraction    . Neck surgery      ? C4-C5  . Tubal ligation    . Dilitation & currettage/hystroscopy with novasure ablation    . Rotator cuff repair      left  . Knee surgery      x 2 - left  . Laparoscopic assisted vaginal hysterectomy N/A 10/09/2014    Procedure: LAPAROSCOPIC ASSISTED VAGINAL HYSTERECTOMY;  Surgeon: Harold HedgeJames Tomblin, MD;  Location: [facility];  Service: Gynecology;  Laterality: N/A;  . Anterior and posterior repair with sacrospinous fixation N/A 10/09/2014    Procedure: ANTERIOR AND POSTERIOR REPAIR WITH SACROSPINOUS LIGAMENT SUSPENSION;  Surgeon: Harold HedgeJames Tomblin, MD;  Location: [facility];  Service: Gynecology;  Laterality: N/A;  . Bilateral salpingectomy Bilateral 10/09/2014    Procedure: BILATERAL SALPINGECTOMY;  Surgeon: Harold HedgeJames Tomblin, MD;  Location: [facility];  Service: Gynecology;  Laterality: Bilateral;    There were no vitals  filed for this visit.      Subjective Assessment - 09/26/15 1126    Subjective Pt reports she had no pain when she woke up yesterday.  "I may have over done it with my weight shifts yesterday, since I felt good".    Currently in Pain? Yes   Pain Score 5    Pain Location Hip   Pain Orientation Left;Anterior;Posterior   Pain Descriptors / Inge Risendicators Dull            [facility] PT Assessment - 09/26/15 0001    Assessment   Medical Diagnosis SI dysfunction    Referring Provider Dr. Venita Lickahari Brooks    Onset Date/Surgical Date 07/15/10   Hand Dominance Left   Next MD Visit 10/08/15   Prior Therapy yes           [facility] Adult PT Treatment/Exercise - 09/26/15 0001    Lumbar Exercises: Stretches   Passive Hamstring Stretch 2 reps;20 seconds  some spasming in Lt quad.    Quad Stretch 5 reps;30 seconds  Seated with foot under chair.    Lumbar Exercises: Aerobic   Stationary Bike NuStep L4: 6 min    Lumbar Exercises: Standing   Other Standing Lumbar Exercises weight shifts side to side, front to back, back to front,  with  Lt foot on scale.  pt applied up to 151#  through LLE.      Lumbar Exercises: Supine   Ab Set 5 reps;5 seconds   Clam 5 reps  RLE   Clam Limitations after 2 reps on LLE increased pain; stopped.    Bent Knee Raise 10 reps  with core engaged.    Knee/Hip Exercises: Stretches   Optician, dispensing reps;30 seconds;Right  supine with strap   Moist Heat Therapy   Number Minutes Moist Heat 15 Minutes   Moist Heat Location Hip  ant, and Lt hamstring   Cryotherapy   Number Minutes Cryotherapy 15 Minutes   Cryotherapy Location Lumbar Spine   Type of Cryotherapy Ice pack   Electrical Stimulation   Electrical Stimulation Location bilat SI joint,   ant / post Lt thigh   Electrical Stimulation Action premod to each area   Electrical Stimulation Parameters to tolerance    Electrical Stimulation Goals Tone;Pain   Ultrasound   Ultrasound Location Lt proximal/ lateral quad     Ultrasound Parameters 100%, 1.2 w/cm2, 8 min    Ultrasound Goals Pain  tightness.                      PT Long Term Goals - 09/17/15 1244    PT LONG TERM GOAL #1   Title Improve trunk and LE mobility to functional level 10/22/15   Time 6   Period Weeks   Status On-going   PT LONG TERM GOAL #2   Title Normal gait pattern witoht assistive device and minimal pain 10/22/15   Time 6   Period Weeks   Status On-going   PT LONG TERM GOAL #3   Title Improve functional activity level with patient to tolerate 30 min of sitting; 15 min of standing and 5-10 min of walking 10/22/15   Time 6   Period Weeks   Status On-going   PT LONG TERM GOAL #4   Title Independent in HEP 10/22/15   Time 6   Period Weeks   Status On-going   PT LONG TERM GOAL #5   Title Improve FOTO to </= 49% limitation 10/22/15   Time 6   Period Weeks   Status On-going               Plan - 09/26/15 1540    Clinical Impression Statement Pt tolerated increased weight through LLE today (151# out of total weight of 164#) within tolerance.  Pt reported good stretch in Lt quad in seated position, without any aggrivating pain in low back. Good response to Rock tape to LLE.  Pt has had good relief with use of [place] and MHP to ant hip and Lt hamstring.  Progressing towards goals.    Rehab Potential Good   PT Frequency 3x / week   PT Duration 6 weeks   PT Treatment/Interventions Patient/family education;Cryotherapy;Electrical Stimulation;Iontophoresis /ml Dexamethasone;Moist Heat;Ultrasound;Neuromuscular re-education;Manual techniques;Dry needling;Therapeutic activities;Therapeutic exercise   PT Next Visit Plan gentle progression of core stabilization and movement; gait training; modalities and manual treatment as indicated. TDN as indicated.    Consulted and Agree with Plan of Care Patient      Patient will benefit from skilled therapeutic intervention in order to improve the following deficits and  impairments:  Postural dysfunction, Improper body mechanics, Pain, Increased fascial restricitons, Increased muscle spasms, Abnormal gait, Decreased strength, Decreased endurance, Decreased activity tolerance  Visit Diagnosis: Myalgia  Other symptoms and signs involving the musculoskeletal system  Weakness generalized  Other abnormalities of gait and mobility     Problem List Patient Active Problem List   Diagnosis Date Noted  . Pelvic relaxation 10/09/2014   Mayer CamelJennifer Carlson-Long, PTA 09/26/2015 3:44 PM  [facility] [address] [place], [place], [address] Phone: [phone]   Fax:  [phone]  Name: Chelsea Risingara Coe MRN: [id number] Date of Birth: 1971/01/11

## 2015-10-02 ENCOUNTER — Encounter: Payer: Self-pay | Admitting: Rehabilitative and Restorative Service Providers"

## 2015-10-04 ENCOUNTER — Ambulatory Visit (INDEPENDENT_AMBULATORY_CARE_PROVIDER_SITE_OTHER): Payer: Worker's Compensation | Admitting: Physical Therapy

## 2015-10-04 DIAGNOSIS — R531 Weakness: Secondary | ICD-10-CM

## 2015-10-04 DIAGNOSIS — M791 Myalgia, unspecified site: Secondary | ICD-10-CM

## 2015-10-04 DIAGNOSIS — R2689 Other abnormalities of gait and mobility: Secondary | ICD-10-CM

## 2015-10-04 DIAGNOSIS — R29898 Other symptoms and signs involving the musculoskeletal system: Secondary | ICD-10-CM | POA: Diagnosis not present

## 2015-10-04 NOTE — Therapy (Signed)
[facility] [address] [place], [place], [address] Phone: [phone]   Fax:  [phone]  Physical Therapy Treatment  Patient Details  Name: Chelsea Risingara Edinger MRN: [id number] Date of Birth: 1970-08-30 Referring Provider: Dr. Shon BatonBrooks  Encounter Date: 10/04/2015      PT End of Session - 10/04/15 1407    Visit Number 6   Number of Visits 18   Date for PT Re-Evaluation 10/23/15   PT Start Time 1405   PT Stop Time 1514   PT Time Calculation (min) 69 min   Activity Tolerance --  some increase in pain with gait training      Past Medical History  Diagnosis Date  . Seasonal allergies   . Chronic bronchitis (HCC)   . History of kidney stones     passed stone - no surgery required  . Headache     otc med prn, sleep  . Anemia   . 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 SI (sacroiliac) pain     ablations done - SI joint - no anesthesia, darvocet only  . SVD (spontaneous vaginal delivery)     x 4    Past Surgical History  Procedure Laterality Date  . Wisdom tooth extraction    . Neck surgery      ? C4-C5  . Tubal ligation    . Dilitation & currettage/hystroscopy with novasure ablation    . Rotator cuff repair      left  . Knee surgery      x 2 - left  . Laparoscopic assisted vaginal hysterectomy N/A 10/09/2014    Procedure: LAPAROSCOPIC ASSISTED VAGINAL HYSTERECTOMY;  Surgeon: Harold HedgeJames Tomblin, MD;  Location: [facility];  Service: Gynecology;  Laterality: N/A;  . Anterior and posterior repair with sacrospinous fixation N/A 10/09/2014    Procedure: ANTERIOR AND POSTERIOR REPAIR WITH SACROSPINOUS LIGAMENT SUSPENSION;  Surgeon: Harold HedgeJames Tomblin, MD;  Location: [facility];  Service: Gynecology;  Laterality: N/A;  . Bilateral salpingectomy Bilateral 10/09/2014    Procedure: BILATERAL SALPINGECTOMY;  Surgeon: Harold HedgeJames Tomblin, MD;  Location: [facility];  Service: Gynecology;  Laterality: Bilateral;    There were no  vitals filed for this visit.      Subjective Assessment - 10/04/15 1408    Subjective Pt had missed last appts due to sinus infection. She was hoping to get TDN treatment.  She was able to get into /out of pool - floated for 15 min. Biggest complaint is the soreness in Lt thigh down to knee daily; hamstring has calmed down.     Currently in Pain? Yes   Pain Score 5    Pain Location Hip   Pain Orientation Left;Anterior   Pain Descriptors / Indicators Spasm   Pain Radiating Towards Lt anterior thigh to knee.             [facility] - 10/04/15 0001    Assessment   Medical Diagnosis SI dysfunction    Referring Provider Dr. Shon BatonBrooks   Onset Date/Surgical Date 07/15/10   Hand Dominance Left   Next MD Visit 10/08/15   Prior Therapy yes           [facility] Adult PT Treatment/Exercise - 10/04/15 0001    Ambulation/Gait   Ambulation/Gait Yes   Ambulation Distance (Feet) --  ~400 total, seated rest breaks in between trials   Assistive device Straight cane  progressed to no device   Gait Pattern Decreased arm  swing - left;Decreased stance time - left;Decreased stride length;Decreased weight shift to left;Step-through pattern  increased ER of bilat hip (toes out)   Gait velocity decreased speed   Lumbar Exercises: Stretches   Quad Stretch 5 reps;30 seconds  Seated with foot under chair.    Lumbar Exercises: Aerobic   Stationary Bike NuStep L4: 6 min    Lumbar Exercises: Standing   Other Standing Lumbar Exercises weight shifts side to side, front to back, back to front,  with Lt foot on scale.  pt applied up to FWB  through LLE.      Lumbar Exercises: Supine   Ab Set 5 reps   Clam --  3 reps each side to ck form.     Knee/Hip Exercises: Stretches   Theme park managerGastroc Stretch Left;3 reps;30 seconds  long sitting with strap   Moist Heat Therapy   Number Minutes Moist Heat 15 Minutes   Moist Heat Location Hip  ant, and Lt hamstring   Cryotherapy   Number Minutes Cryotherapy 15 Minutes    Cryotherapy Location Lumbar Spine   Type of Cryotherapy Ice pack   Electrical Stimulation   Electrical Stimulation Location bilat SI joint. Lt ant thigh    Electrical Stimulation Action premod to each area   Electrical Stimulation Parameters to tolerance    Electrical Stimulation Goals Tone;Pain   Ultrasound   Ultrasound Location Lt TFL   Ultrasound Parameters 100%, 1.2 w/cm2, 8 min    Ultrasound Goals Pain  tightness                     PT Long Term Goals - 09/17/15 1244    PT LONG TERM GOAL #1   Title Improve trunk and LE mobility to functional level 10/22/15   Time 6   Period Weeks   Status On-going   PT LONG TERM GOAL #2   Title Normal gait pattern witoht assistive device and minimal pain 10/22/15   Time 6   Period Weeks   Status On-going   PT LONG TERM GOAL #3   Title Improve functional activity level with patient to tolerate 30 min of sitting; 15 min of standing and 5-10 min of walking 10/22/15   Time 6   Period Weeks   Status On-going   PT LONG TERM GOAL #4   Title Independent in HEP 10/22/15   Time 6   Period Weeks   Status On-going   PT LONG TERM GOAL #5   Title Improve FOTO to </= 49% limitation 10/22/15   Time 6   Period Weeks   Status On-going               Plan - 10/04/15 1515    Clinical Impression Statement Pt tolerated 100% WB with weight shifts, with slight increase in pain.  Pt progressed to gait with cane, then no assistive device with minor deviations; improved with cues.  Pt complains of spasming in Lt TFL and Sartorius region after WB. Pt 's pain level was reduced with Rt sidelying and US to anterior lateral hip. Pt making gradual progress towards goals.    Rehab Potential Good   PT Frequency 3x / week   PT Duration 6 weeks   PT Treatment/Interventions Patient/family education;Cryotherapy;Electrical Stimulation;Iontophoresis 4mg /ml Dexamethasone;Moist Heat;Ultrasound;Neuromuscular re-education;Manual techniques;Dry  needling;Therapeutic activities;Therapeutic exercise   PT Next Visit Plan gentle progression of core stabilization and movement; gait training; modalities and manual treatment as indicated. TDN as indicated.  MD note (appt next day)  Consulted and Agree with Plan of Care Patient      Patient will benefit from skilled therapeutic intervention in order to improve the following deficits and impairments:  Postural dysfunction, Improper body mechanics, Pain, Increased fascial restricitons, Increased muscle spasms, Abnormal gait, Decreased strength, Decreased endurance, Decreased activity tolerance  Visit Diagnosis: Myalgia  Weakness generalized  Other symptoms and signs involving the musculoskeletal system  Other abnormalities of gait and mobility     Problem List Patient Active Problem List   Diagnosis Date Noted  . Pelvic relaxation 10/09/2014   Mayer Camel, PTA 10/04/2015 4:40 PM  [facility] [place] [address] [place], [place], [address] Phone: [phone]   Fax:  [phone]  Name: Chelsea Fritz MRN: [id number] Date of Birth: [birth year]

## 2015-10-07 ENCOUNTER — Ambulatory Visit (INDEPENDENT_AMBULATORY_CARE_PROVIDER_SITE_OTHER): Payer: Worker's Compensation | Admitting: Rehabilitative and Restorative Service Providers"

## 2015-10-07 ENCOUNTER — Encounter: Payer: Self-pay | Admitting: Rehabilitative and Restorative Service Providers"

## 2015-10-07 DIAGNOSIS — R531 Weakness: Secondary | ICD-10-CM

## 2015-10-07 DIAGNOSIS — R29898 Other symptoms and signs involving the musculoskeletal system: Secondary | ICD-10-CM

## 2015-10-07 DIAGNOSIS — R2689 Other abnormalities of gait and mobility: Secondary | ICD-10-CM

## 2015-10-07 DIAGNOSIS — M791 Myalgia, unspecified site: Secondary | ICD-10-CM

## 2015-10-07 NOTE — Therapy (Signed)
[facility] [place] [address] [place], [place], [address] Phone: [phone]   Fax:  [phone]  Physical Therapy Treatment  Patient Details  Name: Chelsea Fritz MRN: [id number] Date of Birth: 04/03/1970 Referring Provider: Dr. Venita Lick   Encounter Date: 10/07/2015      PT End of Session - 10/07/15 1403    Visit Number 7   Number of Visits 18   Date for PT Re-Evaluation 10/23/15   PT Start Time 1403   PT Stop Time 1454   PT Time Calculation (min) 51 min      Past Medical History  Diagnosis Date  . Seasonal allergies   . Chronic bronchitis (HCC)   . History of kidney stones     passed stone - no surgery required  . Headache     otc med prn, sleep  . Anemia   . 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 SI (sacroiliac) pain     ablations done - SI joint - no anesthesia, darvocet only  . SVD (spontaneous vaginal delivery)     x 4    Past Surgical History  Procedure Laterality Date  . Wisdom tooth extraction    . Neck surgery      ? C4-C5  . Tubal ligation    . Dilitation & currettage/hystroscopy with novasure ablation    . Rotator cuff repair      left  . Knee surgery      x 2 - left  . Laparoscopic assisted vaginal hysterectomy N/A 10/09/2014    Procedure: LAPAROSCOPIC ASSISTED VAGINAL HYSTERECTOMY;  Surgeon: Harold Hedge, MD;  Location: [facility];  Service: Gynecology;  Laterality: N/A;  . Anterior and posterior repair with sacrospinous fixation N/A 10/09/2014    Procedure: ANTERIOR AND POSTERIOR REPAIR WITH SACROSPINOUS LIGAMENT SUSPENSION;  Surgeon: Harold Hedge, MD;  Location: [facility];  Service: Gynecology;  Laterality: N/A;  . Bilateral salpingectomy Bilateral 10/09/2014    Procedure: BILATERAL SALPINGECTOMY;  Surgeon: Harold Hedge, MD;  Location: [facility];  Service: Gynecology;  Laterality: Bilateral;    There were no vitals filed for this visit.      Subjective Assessment -  10/07/15 1406    Subjective Chelsea Fritz reports that she is improving gradually. She can tell she has less pain and improved mobility. She is now exercisinog In her pool at home. She climbs up the ladder one step at a time with her husband and son on either side. The water actually feels good - but she is sore all through the sides/both sides today. This is muscle soreness loke she has worked the muscles. Feel sthe TDN is helpful.    Currently in Pain? Yes   Pain Score 5    Pain Orientation Left;Anterior   Pain Descriptors / Indicators Spasm   Pain Type Chronic pain            [facility] PT Assessment - 10/07/15 0001    Assessment   Medical Diagnosis SI dysfunction    Referring Provider Dr. Venita Lick    Onset Date/Surgical Date 07/15/10   Hand Dominance Left   Next MD Visit 10/08/15   Prior Therapy yes    AROM   Overall AROM Comments limited Lt hip mobilty at early/mid/end ranges throughout(not fully assessed) tight and painful throughout; Rt hip - end range limitations  greater mobilty noted that the Lt    Strength   Overall Strength Comments Rt LE grossly WFL's;  Lt not tested through hip due to pain Lt knee flex 4-/5; knee ext 4/5    Flexibility   Hamstrings Lt 62 deg,  Rt 105 deg    Quadriceps Rt tight 110 deg; Lt 90 deg prone heel toward buttock   ITB tight bilat    Piriformis tight bilat    Palpation   Palpation comment tender through sacrum; Lt > Rt SI area; Lt posterior hip musculature into the Lt hamstrings and IT band    Ambulation/Gait   Ambulation/Gait Yes   Ambulation Distance (Feet) --  ~400 total, seated rest breaks in between trials   Assistive device Straight cane  progressed to no device   Gait Pattern Decreased arm swing - left;Decreased stance time - left;Decreased stride length;Decreased weight shift to left;Step-through pattern  increased ER of bilat hip (toes out)   Gait velocity decreased speed                     [facility] Adult PT Treatment/Exercise  - 10/07/15 0001    Moist Heat Therapy   Number Minutes Moist Heat 15 Minutes   Moist Heat Location Hip  ant, and Lt hamstring   Electrical Stimulation   Electrical Stimulation Location Lt IT band; Lt anterior hip flexors/adductors   Electrical Stimulation Action IFC   Electrical Stimulation Parameters to tolerance   Electrical Stimulation Goals Tone;Pain   Manual Therapy   Manual Therapy Soft tissue mobilization;Taping   Manual therapy comments patient supine in hooklying position    Soft tissue mobilization Lt anterior lateral/anterioe medial hip to quad and IT Band    Myofascial Release Lt hip/thigh area           Trigger Point Dry Needling - 10/07/15 1543    Consent Given? Yes   Education Handout Provided Yes   Tensor Fascia Lata Response Twitch response elicited;Palpable increased muscle length   Quadriceps Response Twitch response elicited;Palpable increased muscle length   Adductor Response Twitch response elicited;Palpable increased muscle length                   PT Long Term Goals - 10/07/15 1550    PT LONG TERM GOAL #1   Title Improve trunk and LE mobility to functional level 10/22/15   Time 6   Period Weeks   Status On-going   PT LONG TERM GOAL #2   Title Normal gait pattern witoht assistive device and minimal pain 10/22/15   Time 6   Period Weeks   Status On-going   PT LONG TERM GOAL #3   Title Improve functional activity level with patient to tolerate 30 min of sitting; 15 min of standing and 5-10 min of walking 10/22/15   Time 6   Period Weeks   Status On-going   PT LONG TERM GOAL #4   Title Independent in HEP 10/22/15   Time 6   Period Weeks   Status On-going   PT LONG TERM GOAL #5   Title Improve FOTO to </= 49% limitation 10/22/15   Time 6   Period Weeks   Status On-going               Plan - 10/07/15 1544    Clinical Impression Statement Improving with gentle exercise and working in the water at home. Patient responded well to  TDN trial today with decrease muscular tightness to palpation and decreased pain/discomfort. Activity level is increasing at home. Weight bearing status is increasing with patient now able to  bear full weight throuagh Lt LE with ambulatioin without assistive device for short distances. Anticipate that progress will be slow due to the nature of the surgery; length of time she has had symptoms; extensive surgery with hysterectomy fall 2016 which affects core stability; and ongoing pain and dysfunction in Rt SI joint.    Rehab Potential Good   PT Frequency 3x / week   PT Duration 6 weeks   PT Treatment/Interventions Patient/family education;Cryotherapy;Electrical Stimulation;Iontophoresis 4mg /ml Dexamethasone;Moist Heat;Ultrasound;Neuromuscular re-education;Manual techniques;Dry needling;Therapeutic activities;Therapeutic exercise   PT Next Visit Plan gentle progression of core stabilization and movement; gait training; modalities and manual treatment as indicated. TDN as indicated.    Consulted and Agree with Plan of Care Patient      Patient will benefit from skilled therapeutic intervention in order to improve the following deficits and impairments:  Postural dysfunction, Improper body mechanics, Pain, Increased fascial restricitons, Increased muscle spasms, Abnormal gait, Decreased strength, Decreased endurance, Decreased activity tolerance  Visit Diagnosis: Weakness generalized  Myalgia  Other symptoms and signs involving the musculoskeletal system  Other abnormalities of gait and mobility     Problem List Patient Active Problem List   Diagnosis Date Noted  . Pelvic relaxation 10/09/2014    Alton Tremblay Rober Minion PT, MPH  10/07/2015, 3:57 PM  [facility] [address] [place], [place], [address] Phone: [phone]   Fax:  [phone]  Name: Chelsea Fritz MRN: [id number] Date of Birth: [birth year]

## 2015-10-11 ENCOUNTER — Ambulatory Visit (INDEPENDENT_AMBULATORY_CARE_PROVIDER_SITE_OTHER): Payer: Worker's Compensation | Admitting: Rehabilitative and Restorative Service Providers"

## 2015-10-11 ENCOUNTER — Encounter: Payer: Self-pay | Admitting: Rehabilitative and Restorative Service Providers"

## 2015-10-11 DIAGNOSIS — M791 Myalgia, unspecified site: Secondary | ICD-10-CM

## 2015-10-11 DIAGNOSIS — R29898 Other symptoms and signs involving the musculoskeletal system: Secondary | ICD-10-CM

## 2015-10-11 DIAGNOSIS — R2689 Other abnormalities of gait and mobility: Secondary | ICD-10-CM

## 2015-10-11 DIAGNOSIS — R531 Weakness: Secondary | ICD-10-CM

## 2015-10-11 NOTE — Therapy (Signed)
[facility] [place] [address] [place], [place], [address] Phone: [phone]   Fax:  [phone]  Physical Therapy Treatment  Patient Details  Name: Chelsea Fritz MRN: [id number] Date of Birth: 03/24/1971 Referring Provider: Dr. Venita Lick   Encounter Date: 10/11/2015      PT End of Session - 10/11/15 1105    Visit Number 8   Number of Visits 18   Date for PT Re-Evaluation 10/23/15   PT Start Time 1102   PT Stop Time 1156   PT Time Calculation (min) 54 min   Activity Tolerance Patient tolerated treatment well      Past Medical History  Diagnosis Date  . Seasonal allergies   . Chronic bronchitis (HCC)   . History of kidney stones     passed stone - no surgery required  . Headache     otc med prn, sleep  . Anemia   . 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 SI (sacroiliac) pain     ablations done - SI joint - no anesthesia, darvocet only  . SVD (spontaneous vaginal delivery)     x 4    Past Surgical History  Procedure Laterality Date  . Wisdom tooth extraction    . Neck surgery      ? C4-C5  . Tubal ligation    . Dilitation & currettage/hystroscopy with novasure ablation    . Rotator cuff repair      left  . Knee surgery      x 2 - left  . Laparoscopic assisted vaginal hysterectomy N/A 10/09/2014    Procedure: LAPAROSCOPIC ASSISTED VAGINAL HYSTERECTOMY;  Surgeon: Harold Hedge, MD;  Location: [facility];  Service: Gynecology;  Laterality: N/A;  . Anterior and posterior repair with sacrospinous fixation N/A 10/09/2014    Procedure: ANTERIOR AND POSTERIOR REPAIR WITH SACROSPINOUS LIGAMENT SUSPENSION;  Surgeon: Harold Hedge, MD;  Location: [facility];  Service: Gynecology;  Laterality: N/A;  . Bilateral salpingectomy Bilateral 10/09/2014    Procedure: BILATERAL SALPINGECTOMY;  Surgeon: Harold Hedge, MD;  Location: [facility];  Service: Gynecology;  Laterality: Bilateral;    There were no vitals  filed for this visit.      Subjective Assessment - 10/11/15 1109    Subjective Chelsea Fritz reports that she was seen by Dr. Shon Baton Tuesday and he will keep her out of work until at least 11/03/15. He feels that she is OK to continue with one crutch until the muscle tightness and balance are improved. Patient reports that she was very sore following the TDN but can tell it feels better now. Pleaed with progress.    Currently in Pain? Yes   Pain Score 5    Pain Location Hip   Pain Orientation Left;Anterior   Pain Descriptors / Indicators Spasm   Pain Type Chronic pain   Pain Onset More than a month ago   Pain Frequency Intermittent                         [facility] Adult PT Treatment/Exercise - 10/11/15 0001    Ambulation/Gait   Ambulation/Gait Yes   Ambulation Distance (Feet) --  500 ft   Assistive device Straight cane   Gait Pattern --  ER bilat LE's - pt reports that is normal gait pattern   Gait velocity decreased speed   Gait Comments practiced gait with cane to allow pt to be comfortale with cane for  unlevel surfaces    Lumbar Exercises: Stretches   Passive Hamstring Stretch 3 reps;30 seconds   ITB Stretch 30 seconds;2 reps   Lumbar Exercises: Aerobic   Stationary Bike NuStep L5: 4 min    Knee/Hip Exercises: Standing   Other Standing Knee Exercises standing n 2 inch foam - working on steppin in place for ~ 1 min; step up x 5 with each LE    Moist Heat Therapy   Number Minutes Moist Heat 18 Minutes   Moist Heat Location Hip;Lumbar Spine  ant, and Lt hamstring; lateral leg/calf   Electrical Stimulation   Electrical Stimulation Location Lt IT band; Lt anterior hip flexors/adductors   Electrical Stimulation Action IFC   Electrical Stimulation Parameters to tolerance   Electrical Stimulation Goals Tone;Pain   Manual Therapy   Manual Therapy Soft tissue mobilization;Taping   Manual therapy comments patient supine in hooklying position    Soft tissue mobilization Lt  anterior lateral/anterior medial hip to quad and IT Band; Lt lateral leg at peroneals/distal to head of the fibula    Myofascial Release Lt hip/thigh area           Trigger Point Dry Needling - 10/11/15 1159    Consent Given? Yes   Muscles Treated Lower Body --  Lt peroneal - one needle only - twitch/decreased tightness               PT Education - 10/11/15 1120    Education provided Yes   Education Details water exercise    Person(s) Educated Patient   Methods Explanation   Comprehension Verbalized understanding             PT Long Term Goals - 10/07/15 1550    PT LONG TERM GOAL #1   Title Improve trunk and LE mobility to functional level 10/22/15   Time 6   Period Weeks   Status On-going   PT LONG TERM GOAL #2   Title Normal gait pattern witoht assistive device and minimal pain 10/22/15   Time 6   Period Weeks   Status On-going   PT LONG TERM GOAL #3   Title Improve functional activity level with patient to tolerate 30 min of sitting; 15 min of standing and 5-10 min of walking 10/22/15   Time 6   Period Weeks   Status On-going   PT LONG TERM GOAL #4   Title Independent in HEP 10/22/15   Time 6   Period Weeks   Status On-going   PT LONG TERM GOAL #5   Title Improve FOTO to </= 49% limitation 10/22/15   Time 6   Period Weeks   Status On-going               Plan - 10/11/15 1113    Clinical Impression Statement continued improvement on a gradual basis. Good response to TDN after several days of soreness. Patient reports that she feels the best leaving that she has felt. Responded well to one TDN stick without c/o of continued soreness post treatment and report of good relaxation through leg with walking. Progressing well with water exercise. Progressed her water exercise for home and suggested she try a little walking and exercise twice a day for one day over the weekend. Progressing well  toward goals of therapy. Slow progress to be expected due to  long standing nature of problems.    Rehab Potential Good   PT Frequency 2x / week   PT Duration 6 weeks  PT Treatment/Interventions Patient/family education;Cryotherapy;Electrical Stimulation;Iontophoresis 4mg /ml Dexamethasone;Moist Heat;Ultrasound;Neuromuscular re-education;Manual techniques;Dry needling;Therapeutic activities;Therapeutic exercise   PT Next Visit Plan gentle progression of core stabilization and movement; gait training; modalities and manual treatment as indicated. TDN as indicated.    Consulted and Agree with Plan of Care Patient      Patient will benefit from skilled therapeutic intervention in order to improve the following deficits and impairments:  Postural dysfunction, Improper body mechanics, Pain, Increased fascial restricitons, Increased muscle spasms, Abnormal gait, Decreased strength, Decreased endurance, Decreased activity tolerance  Visit Diagnosis: Weakness generalized  Myalgia  Other symptoms and signs involving the musculoskeletal system  Other abnormalities of gait and mobility     Problem List Patient Active Problem List   Diagnosis Date Noted  . Pelvic relaxation 10/09/2014    Celyn Rober MinionP Holt PT, MPH  10/11/2015, 12:17 PM  [facility] [address] [place], [place], [address] Phone: [phone]   Fax:  [phone]  Name: Chelsea Fritz MRN: [id number] Date of Birth: October 02, 1970

## 2015-10-11 NOTE — Patient Instructions (Addendum)
In the pool   Walking backwards  Sidesteps - both directions  Standing at side of pool - moving arms forward/back; wings; punches; swishing arms side to side; hand circles    Standing on pillow with arms supporting on counter or rail   Little step ups in the pool - about 2 inches 5-10 reps on each leg

## 2015-10-15 ENCOUNTER — Ambulatory Visit (INDEPENDENT_AMBULATORY_CARE_PROVIDER_SITE_OTHER): Payer: Worker's Compensation | Admitting: Rehabilitative and Restorative Service Providers"

## 2015-10-15 ENCOUNTER — Encounter: Payer: Self-pay | Admitting: Rehabilitative and Restorative Service Providers"

## 2015-10-15 DIAGNOSIS — R29898 Other symptoms and signs involving the musculoskeletal system: Secondary | ICD-10-CM | POA: Diagnosis not present

## 2015-10-15 DIAGNOSIS — R2689 Other abnormalities of gait and mobility: Secondary | ICD-10-CM | POA: Diagnosis not present

## 2015-10-15 DIAGNOSIS — R531 Weakness: Secondary | ICD-10-CM

## 2015-10-15 DIAGNOSIS — M791 Myalgia, unspecified site: Secondary | ICD-10-CM

## 2015-10-15 NOTE — Therapy (Signed)
[facility] [place] [address] [place], [place], [address] Phone: [phone]   Fax:  [phone]  Physical Therapy Treatment  Patient Details  Name: Chelsea Fritz MRN: [id number] Date of Birth: 12-08-1970 Referring Provider: Dr. Venita Lick   Encounter Date: 10/15/2015      PT End of Session - 10/15/15 1150    Visit Number 9   Number of Visits 18   Date for PT Re-Evaluation 10/23/15   PT Start Time 1108   PT Stop Time 1208   PT Time Calculation (min) 60 min   Activity Tolerance Patient tolerated treatment well      Past Medical History  Diagnosis Date  . Seasonal allergies   . Chronic bronchitis (HCC)   . History of kidney stones     passed stone - no surgery required  . Headache     otc med prn, sleep  . Anemia   . 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 SI (sacroiliac) pain     ablations done - SI joint - no anesthesia, darvocet only  . SVD (spontaneous vaginal delivery)     x 4    Past Surgical History  Procedure Laterality Date  . Wisdom tooth extraction    . Neck surgery      ? C4-C5  . Tubal ligation    . Dilitation & currettage/hystroscopy with novasure ablation    . Rotator cuff repair      left  . Knee surgery      x 2 - left  . Laparoscopic assisted vaginal hysterectomy N/A 10/09/2014    Procedure: LAPAROSCOPIC ASSISTED VAGINAL HYSTERECTOMY;  Surgeon: Harold Hedge, MD;  Location: [facility];  Service: Gynecology;  Laterality: N/A;  . Anterior and posterior repair with sacrospinous fixation N/A 10/09/2014    Procedure: ANTERIOR AND POSTERIOR REPAIR WITH SACROSPINOUS LIGAMENT SUSPENSION;  Surgeon: Harold Hedge, MD;  Location: [facility];  Service: Gynecology;  Laterality: N/A;  . Bilateral salpingectomy Bilateral 10/09/2014    Procedure: BILATERAL SALPINGECTOMY;  Surgeon: Harold Hedge, MD;  Location: [facility];  Service: Gynecology;  Laterality: Bilateral;    There were no vitals  filed for this visit.      Subjective Assessment - 10/15/15 1259    Subjective Amazed at the difference the TDN makes. Much looser through the calffollowing last session. Added the pool exercises at home/exercises at home without difficulty. Does report that she is noticing more pain in her LB lately. Some tightness through the calf following TDN today - partially resolved with stretching. Better at exit    Currently in Pain? Yes   Pain Score 4    Pain Location Hip   Pain Orientation Left;Anterior   Pain Descriptors / Indicators Tightness   Pain Type Chronic pain   Pain Onset More than a month ago   Pain Frequency Intermittent                         [facility] Adult PT Treatment/Exercise - 10/15/15 0001    Exercises   Exercises --  gastroc/soleus 30 sec x 3 each    Lumbar Exercises: Stretches   Passive Hamstring Stretch 3 reps;30 seconds   ITB Stretch 30 seconds;2 reps   Knee/Hip Exercises: Stretches   Gastroc Stretch 3 reps;30 seconds;Left;Right  standing    Gastroc Stretch Limitations stretch in sitting with strap post treatment due to spasm    Soleus Stretch 3  reps;30 seconds;Left;Right  standing    Moist Heat Therapy   Number Minutes Moist Heat 20 Minutes   Moist Heat Location Hip;Lumbar Spine  ant, and Lt hamstring; lateral leg/calf   Electrical Stimulation   Electrical Stimulation Location Lt IT band; Lt anterior hip flexors/adductors   Electrical Stimulation Action IFC   Electrical Stimulation Parameters to tolerance   Electrical Stimulation Goals Tone;Pain   Manual Therapy   Manual Therapy Soft tissue mobilization;Taping   Manual therapy comments patient supine in hooklying position and prone    Soft tissue mobilization Lt anterior lateral/anterior medial hip to quad and IT Band; Lt lateral leg at peroneals/distal to head of the fibula - hamstrings in prone    Myofascial Release Lt hip/thigh area           Trigger Point Dry Needling - 10/15/15 1148     Consent Given? Yes   Muscles Treated Lower Body --  Lt LE    Tensor Fascia Lata Response Twitch response elicited;Palpable increased muscle length   Quadriceps Response Twitch response elicited;Palpable increased muscle length   Adductor Response Twitch response elicited;Palpable increased muscle length   Hamstring Response Twitch response elicited;Palpable increased muscle length   Gastrocnemius Response Twitch response elicited;Palpable increased muscle length              PT Education - 10/15/15 1150    Education provided Yes   Education Details HEP   Person(s) Educated Patient   Methods Explanation;Demonstration;Tactile cues;Verbal cues;Handout   Comprehension Verbalized understanding;Returned demonstration;Verbal cues required;Tactile cues required             PT Long Term Goals - 10/15/15 1305    PT LONG TERM GOAL #1   Title Improve trunk and LE mobility to functional level 10/22/15   Time 6   Period Weeks   Status On-going   PT LONG TERM GOAL #2   Title Normal gait pattern witoht assistive device and minimal pain 10/22/15   Time 6   Period Weeks   Status On-going   PT LONG TERM GOAL #3   Title Improve functional activity level with patient to tolerate 30 min of sitting; 15 min of standing and 5-10 min of walking 10/22/15   Time 6   Period Weeks   Status On-going   PT LONG TERM GOAL #4   Title Independent in HEP 10/22/15   Time 6   Period Weeks   Status On-going   PT LONG TERM GOAL #5   Title Improve FOTO to </= 49% limitation 10/22/15   Time 6   Period Weeks   Status On-going               Plan - 10/15/15 1303    Clinical Impression Statement Responding well to TDN Lt LE. Requesting TDN. Progressing with water exercise program at home without difficulty. Moving better to change positions and transfer. Less palpable tightness through Lt LE. Progressing fradually toward stated goals of therapy.    Rehab Potential Good   PT Frequency 2x / week    PT Duration 6 weeks   PT Treatment/Interventions Patient/family education;Cryotherapy;Electrical Stimulation;Iontophoresis 4mg /ml Dexamethasone;Moist Heat;Ultrasound;Neuromuscular re-education;Manual techniques;Dry needling;Therapeutic activities;Therapeutic exercise   PT Next Visit Plan gentle progression of core stabilization and movement; gait training; modalities and manual treatment as indicated. TDN as indicated.    Consulted and Agree with Plan of Care Patient      Patient will benefit from skilled therapeutic intervention in order to improve the following deficits and impairments:  Postural dysfunction, Improper body mechanics, Pain, Increased fascial restricitons, Increased muscle spasms, Abnormal gait, Decreased strength, Decreased endurance, Decreased activity tolerance  Visit Diagnosis: Weakness generalized  Myalgia  Other symptoms and signs involving the musculoskeletal system  Other abnormalities of gait and mobility     Problem List Patient Active Problem List   Diagnosis Date Noted  . Pelvic relaxation 10/09/2014    Celyn Rober Minion PT, MPH  10/15/2015, 1:06 PM  [facility] [address] [place], [place], [address] Phone: [phone]   Fax:  [phone]  Name: Chelsea Fritz MRN: [id number] Date of Birth: 06/23/1970

## 2015-10-15 NOTE — Patient Instructions (Signed)
Achilles / Gastroc, Standing    Stand, right foot behind, heel on floor and turned slightly out, leg straight, forward leg bent. Move hips forward. Hold __30_ seconds. Repeat _3__ times per session. Do __2_ sessions per day.   Achilles / Soleus, Standing    Stand, right foot behind, heel on floor and turned slightly out. Lower hips and bend knees. Hold _30_ seconds. Repeat _3__ times per session. Do __2_ sessions per day.  Copyright  VHI. All rights reserved.   Copyright  VHI. All rights reserved.

## 2015-10-17 ENCOUNTER — Ambulatory Visit (INDEPENDENT_AMBULATORY_CARE_PROVIDER_SITE_OTHER): Payer: BC Managed Care – PPO | Admitting: Physical Therapy

## 2015-10-17 ENCOUNTER — Encounter: Payer: Self-pay | Admitting: Physical Therapy

## 2015-10-17 DIAGNOSIS — R531 Weakness: Secondary | ICD-10-CM | POA: Diagnosis not present

## 2015-10-17 DIAGNOSIS — M791 Myalgia, unspecified site: Secondary | ICD-10-CM

## 2015-10-17 DIAGNOSIS — R29898 Other symptoms and signs involving the musculoskeletal system: Secondary | ICD-10-CM | POA: Diagnosis not present

## 2015-10-17 DIAGNOSIS — R2689 Other abnormalities of gait and mobility: Secondary | ICD-10-CM | POA: Diagnosis not present

## 2015-10-17 NOTE — Therapy (Signed)
[facility] [address] [place], [place], [address] Phone: [phone]   Fax:  [phone]  Physical Therapy Treatment  Patient Details  Name: Chelsea Risingara Virnig MRN: [id number] Date of Birth: 1970/11/30 Referring Provider: Dr. Venita Lickahari Brooks   Encounter Date: 10/17/2015      PT End of Session - 10/17/15 1150    Visit Number 10   Number of Visits 18   Date for PT Re-Evaluation 10/23/15   PT Start Time 1146   PT Stop Time 1304   PT Time Calculation (min) 78 min   Activity Tolerance Patient tolerated treatment well      Past Medical History  Diagnosis Date  . Seasonal allergies   . Chronic bronchitis (HCC)   . History of kidney stones     passed stone - no surgery required  . Headache     otc med prn, sleep  . Anemia   . 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 SI (sacroiliac) pain     ablations done - SI joint - no anesthesia, darvocet only  . SVD (spontaneous vaginal delivery)     x 4    Past Surgical History  Procedure Laterality Date  . Wisdom tooth extraction    . Neck surgery      ? C4-C5  . Tubal ligation    . Dilitation & currettage/hystroscopy with novasure ablation    . Rotator cuff repair      left  . Knee surgery      x 2 - left  . Laparoscopic assisted vaginal hysterectomy N/A 10/09/2014    Procedure: LAPAROSCOPIC ASSISTED VAGINAL HYSTERECTOMY;  Surgeon: Harold HedgeJames Tomblin, MD;  Location: [facility];  Service: Gynecology;  Laterality: N/A;  . Anterior and posterior repair with sacrospinous fixation N/A 10/09/2014    Procedure: ANTERIOR AND POSTERIOR REPAIR WITH SACROSPINOUS LIGAMENT SUSPENSION;  Surgeon: Harold HedgeJames Tomblin, MD;  Location: [facility];  Service: Gynecology;  Laterality: N/A;  . Bilateral salpingectomy Bilateral 10/09/2014    Procedure: BILATERAL SALPINGECTOMY;  Surgeon: Harold HedgeJames Tomblin, MD;  Location: [facility];  Service: Gynecology;  Laterality: Bilateral;    There were no vitals  filed for this visit.      Subjective Assessment - 10/17/15 1148    Subjective Chelsea Bisonara has had no hamstrings spasms since Tuesday , her quad is still sore. Rt side SIJ point having an ablasion on 10/29/15   Currently in Pain? Yes   Pain Score 4    Pain Location --  anterior thigh   Pain Orientation Left   Pain Descriptors / Indicators Aching   Pain Frequency Intermittent   Aggravating Factors  sitting 17' or more   Pain Relieving Factors stretching                         [facility] Adult PT Treatment/Exercise - 10/17/15 0001    Lumbar Exercises: Aerobic   Stationary Bike L4x5'   Lumbar Exercises: Supine   Ab Set 5 reps;5 seconds   Clam 10 reps  one leg bent, one straight , use of abdominal stabilzer    Heel Slides 10 reps  with abdominal stabilizer   Bent Knee Raise 10 reps  with abdominal stabilzer    Lumbar Exercises: Prone   Other Prone Lumbar Exercises 5 reps pelvic press, then 10 reps with knee flex/ext bilat.    Knee/Hip Exercises: Stretches   LobbyistQuad Stretch Both;2 reps;30 seconds  prone  with strap   Gastroc Stretch Both;30 seconds   Soleus Stretch Both;30 seconds   Modalities   Modalities Electrical Stimulation;Moist Heat   Moist Heat Therapy   Number Minutes Moist Heat 15 Minutes   Moist Heat Location Lumbar Spine  thigh Lt anterior   Electrical Stimulation   Electrical Stimulation Location bilat buttocks/low back and Lt thigh   Electrical Stimulation Action IFC   Electrical Stimulation Parameters to tolerance   Electrical Stimulation Goals Tone;Pain   Manual Therapy   Manual Therapy Soft tissue mobilization;Taping   Soft tissue mobilization LT quad, bilat gluts/buttocks, lumbar paraspinals, and QL          Trigger Point Dry Needling - 10/17/15 1236    Consent Given? Yes   Education Handout Provided No   Muscles Treated Lower Body Quadriceps;Piriformis;Gluteus maximus;Gluteus minimus  bilat gluts, rt piriformis, Lt QL, Lt quad   Gluteus Maximus  Response Palpable increased muscle length;Twitch response elicited  bilat   Gluteus Minimus Response Palpable increased muscle length;Twitch response elicited  bilat   Piriformis Response Palpable increased muscle length;Twitch response elicited  Rt    Quadriceps Response Palpable increased muscle length;Twitch response elicited  Lt with stim              PT Education - 10/17/15 1206    Education provided Yes   Education Details HEP   Person(s) Educated Patient   Methods Explanation;Handout   Comprehension Returned demonstration;Verbalized understanding             PT Long Term Goals - 10/15/15 1305    PT LONG TERM GOAL #1   Title Improve trunk and LE mobility to functional level 10/22/15   Time 6   Period Weeks   Status On-going   PT LONG TERM GOAL #2   Title Normal gait pattern witoht assistive device and minimal pain 10/22/15   Time 6   Period Weeks   Status On-going   PT LONG TERM GOAL #3   Title Improve functional activity level with patient to tolerate 30 min of sitting; 15 min of standing and 5-10 min of walking 10/22/15   Time 6   Period Weeks   Status On-going   PT LONG TERM GOAL #4   Title Independent in HEP 10/22/15   Time 6   Period Weeks   Status On-going   PT LONG TERM GOAL #5   Title Improve FOTO to </= 49% limitation 10/22/15   Time 6   Period Weeks   Status On-going               Plan - 10/17/15 1259    Clinical Impression Statement Chelsea Fritz was very sensitive in bilat gluts, Lt QL and Lt quad.  She responded well to TDN to these areas. She did well with core stabilization exercises and the abdominal stabilizer helped provide feedback to ensure good form.    Rehab Potential Good   PT Frequency 2x / week   PT Duration 6 weeks   PT Treatment/Interventions Patient/family education;Cryotherapy;Electrical Stimulation;Iontophoresis 4mg /ml Dexamethasone;Moist Heat;Ultrasound;Neuromuscular re-education;Manual techniques;Dry needling;Therapeutic  activities;Therapeutic exercise   PT Next Visit Plan Pt is going to be out of town next week, reassess when she returns from vacation   Consulted and Agree with Plan of Care Patient      Patient will benefit from skilled therapeutic intervention in order to improve the following deficits and impairments:  Postural dysfunction, Improper body mechanics, Pain, Increased fascial restricitons, Increased muscle spasms, Abnormal gait, Decreased strength, Decreased  endurance, Decreased activity tolerance  Visit Diagnosis: Weakness generalized  Myalgia  Other symptoms and signs involving the musculoskeletal system  Other abnormalities of gait and mobility     Problem List Patient Active Problem List   Diagnosis Date Noted  . Pelvic relaxation 10/09/2014    Chelsea Fritz PT  10/17/2015, 1:02 PM  [facility] [address] [place], [place], [address] Phone: [phone]   Fax:  [phone]  Name: Chelsea Fritz MRN: [id number] Date of Birth: 1971-01-04

## 2015-10-17 NOTE — Patient Instructions (Signed)
  Abdominal Bracing With Pelvic Floor (Hook-Lying)   With neutral spine, tighten pelvic floor and abdominals. Hold 10 seconds. Repeat __10_ times. Do _1__ times a day.   Knee to Chest: Transverse Plane Stability   Bring one knee up, then return. Be sure pelvis does not roll side to side. Keep pelvis still. Lift knee __10_ times each leg. Restabilize pelvis. Repeat with other leg. Do _1-2__ sets, _1__ times per day.   Hip External Rotation With Pillow: Transverse Plane Stability   One knee bent, one leg straight, on pillow. Slowly roll bent knee out. Be sure pelvis does not rotate. Do _10__ times. Restabilize pelvis. Repeat with other leg. Do _1-2__ sets, _1__ times per day.  Heel Slide: 4-10 Inches - Transverse Plane Stability   Slide heel 4 inches down. Be sure pelvis does not rotate. Do _10__ times. Restabilize pelvis. Repeat with other leg. Do __1_ sets, _1__ times per day.   [facility] [address] [place], [place] [address]  [phone] (office) [phone] (fax)   

## 2015-10-28 ENCOUNTER — Encounter: Payer: Self-pay | Admitting: Rehabilitative and Restorative Service Providers"

## 2015-10-28 ENCOUNTER — Ambulatory Visit (INDEPENDENT_AMBULATORY_CARE_PROVIDER_SITE_OTHER): Payer: BC Managed Care – PPO | Admitting: Rehabilitative and Restorative Service Providers"

## 2015-10-28 DIAGNOSIS — R531 Weakness: Secondary | ICD-10-CM

## 2015-10-28 DIAGNOSIS — R29898 Other symptoms and signs involving the musculoskeletal system: Secondary | ICD-10-CM

## 2015-10-28 DIAGNOSIS — M791 Myalgia, unspecified site: Secondary | ICD-10-CM

## 2015-10-28 DIAGNOSIS — R2689 Other abnormalities of gait and mobility: Secondary | ICD-10-CM

## 2015-10-28 NOTE — Therapy (Signed)
[facility] [place] [address] [address] [place], [place], [address] Phone: [phone]   Fax:  [phone]  Physical Therapy Treatment  Patient Details  Name: Chelsea Fritz MRN: [id number] Date of Birth: Apr 19, 1970 Referring Provider: Dr. Venita Lick   Encounter Date: 10/28/2015      PT End of Session - 10/28/15 1032    Visit Number 11   Number of Visits 24   Date for PT Re-Evaluation 12/09/15   PT Start Time 1019   PT Stop Time 1115   PT Time Calculation (min) 56 min   Activity Tolerance Patient tolerated treatment well      Past Medical History:  Diagnosis Date  . Anemia   . Chronic bronchitis (HCC)   . Headache    otc med prn, sleep  . History of kidney stones    passed stone - no surgery required  . 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 Seasonal allergies   . SI (sacroiliac) pain    ablations done - SI joint - no anesthesia, darvocet only  . SVD (spontaneous vaginal delivery)    x 4    Past Surgical History:  Procedure Laterality Date  . ANTERIOR AND POSTERIOR REPAIR WITH SACROSPINOUS FIXATION N/A 10/09/2014   Procedure: ANTERIOR AND POSTERIOR REPAIR WITH SACROSPINOUS LIGAMENT SUSPENSION;  Surgeon: Harold Hedge, MD;  Location: [facility];  Service: Gynecology;  Laterality: N/A;  . BILATERAL SALPINGECTOMY Bilateral 10/09/2014   Procedure: BILATERAL SALPINGECTOMY;  Surgeon: Harold Hedge, MD;  Location: [facility];  Service: Gynecology;  Laterality: Bilateral;  . DILITATION & CURRETTAGE/HYSTROSCOPY WITH NOVASURE ABLATION    . KNEE SURGERY     x 2 - left  . LAPAROSCOPIC ASSISTED VAGINAL HYSTERECTOMY N/A 10/09/2014   Procedure: LAPAROSCOPIC ASSISTED VAGINAL HYSTERECTOMY;  Surgeon: Harold Hedge, MD;  Location: [facility];  Service: Gynecology;  Laterality: N/A;  . NECK SURGERY     ? C4-C5  . ROTATOR CUFF REPAIR     left  . TUBAL LIGATION    . WISDOM TOOTH EXTRACTION      There were no vitals filed  for this visit.      Subjective Assessment - 10/28/15 1027    Subjective Did well on vacation. No pain in the Lt hip or quad area until Thursday. Did well with walking in the sand with one crutch. Not using crutch in the house but still fearful on unlevel surfaces. Anxious to get rid of the crutch all together    Currently in Pain? Yes   Pain Score 4    Pain Location Hip  anterior hip and thigh   Pain Orientation Left   Pain Descriptors / Indicators Aching;Tightness   Pain Type Chronic pain   Pain Onset More than a month ago   Pain Frequency Intermittent                         [facility] Adult PT Treatment/Exercise - 10/28/15 0001      Ambulation/Gait   Gait Comments gait without assistive device level and unlevel surfaces(grass outside office) 8 ft x 1; 180 ft x 1(80 ft on grass) standby assist only. LE's in ER with toe out gait.      Lumbar Exercises: Stretches   Passive Hamstring Stretch 3 reps;30 seconds   ITB Stretch 30 seconds;2 reps     Lumbar Exercises: Standing   Other Standing Lumbar Exercises weight shifts side to side, front to  back, back to front; marching in place on blue foam pad   Other Standing Lumbar Exercises step ups 4 inch step 10 reps each side 2 sets     Knee/Hip Exercises: Stretches   Lobbyist Both;2 reps;30 seconds  prone with strap     Moist Heat Therapy   Number Minutes Moist Heat 20 Minutes   Moist Heat Location Lumbar Spine  thigh Lt anterior     Electrical Stimulation   Electrical Stimulation Location bilat buttocks/low back and Lt thigh   Electrical Stimulation Action IFC   Electrical Stimulation Parameters to tolerance   Electrical Stimulation Goals Tone;Pain     Manual Therapy   Manual Therapy Soft tissue mobilization;Taping   Manual therapy comments patient supine in hooklying position and prone    Soft tissue mobilization Lt anterior thigh; quads; TFL; ITB   Myofascial Release Lt hip/thigh area            Trigger Point Dry Needling - 10/28/15 1111    Consent Given? Yes   Tensor Fascia Lata Response Twitch response elicited;Palpable increased muscle length   Quadriceps Response Twitch response elicited;Palpable increased muscle length  vastus lateralis - produced symptoms into knee                    PT Long Term Goals - 10/28/15 1031      PT LONG TERM GOAL #1   Title Improve trunk and LE mobility to functional level 12/09/15   Time 6   Period Weeks   Status Revised     PT LONG TERM GOAL #2   Title Normal gait pattern witoht assistive device and minimal pain over level and unlevel surfaces 12/09/15   Time 6   Period Weeks   Status Revised     PT LONG TERM GOAL #3   Title Improve functional activity level with patient to tolerate 30 min of sitting; 15 min of standing and 5-10 min of walking 12/09/15   Time 6   Period Weeks   Status Revised     PT LONG TERM GOAL #4   Title Independent in HEP 12/09/15   Time 6   Period Weeks   Status Revised     PT LONG TERM GOAL #5   Title Improve FOTO to </= 49% limitation 12/09/15   Time 6   Period Weeks   Status Revised               Plan - 10/28/15 1039    Clinical Impression Statement Added step ups and walking on unlevel surfaces without difficulty - some fatigue and soreness reported. Pt will wean from the crutch and begin to practice unlevel surfaces in controled environments. Responds well to TDN. Progressing toward stated goals of therpya. Has ablasion for Rt SI tomorrow.    Rehab Potential Good   PT Frequency 2x / week   PT Duration 6 weeks   PT Treatment/Interventions Patient/family education;Cryotherapy;Electrical Stimulation;Iontophoresis 4mg /ml Dexamethasone;Moist Heat;Ultrasound;Neuromuscular re-education;Manual techniques;Dry needling;Therapeutic activities;Therapeutic exercise   PT Next Visit Plan Pt is progressing well. Continue with stabilization and strengthening and TDN/manual work/modalities as  indicated.    Consulted and Agree with Plan of Care Patient      Patient will benefit from skilled therapeutic intervention in order to improve the following deficits and impairments:  Postural dysfunction, Improper body mechanics, Pain, Increased fascial restricitons, Increased muscle spasms, Abnormal gait, Decreased strength, Decreased endurance, Decreased activity tolerance  Visit Diagnosis: Weakness generalized - Plan: PT plan of  care cert/re-cert  Myalgia - Plan: PT plan of care cert/re-cert  Other symptoms and signs involving the musculoskeletal system - Plan: PT plan of care cert/re-cert  Other abnormalities of gait and mobility - Plan: PT plan of care cert/re-cert     Problem List Patient Active Problem List   Diagnosis Date Noted  . Pelvic relaxation 10/09/2014    Loisann Roach Rober Minion PT, MPH  10/28/2015, 11:20 AM  [facility] [address] [place] [address] [place], [place], [address] Phone: [phone]   Fax:  [phone]  Name: Chelsea Fritz MRN: [id number] Date of Birth: [birth year]

## 2015-10-31 ENCOUNTER — Ambulatory Visit (INDEPENDENT_AMBULATORY_CARE_PROVIDER_SITE_OTHER): Payer: Worker's Compensation | Admitting: Physical Therapy

## 2015-10-31 DIAGNOSIS — R29898 Other symptoms and signs involving the musculoskeletal system: Secondary | ICD-10-CM | POA: Diagnosis not present

## 2015-10-31 DIAGNOSIS — R531 Weakness: Secondary | ICD-10-CM

## 2015-10-31 DIAGNOSIS — R2689 Other abnormalities of gait and mobility: Secondary | ICD-10-CM | POA: Diagnosis not present

## 2015-10-31 DIAGNOSIS — M791 Myalgia, unspecified site: Secondary | ICD-10-CM

## 2015-10-31 NOTE — Therapy (Signed)
[facility] [place] [address] [place] [address] [place], [place], [address] Phone: [phone]   Fax:  [phone]  Physical Therapy Treatment  Patient Details  Name: Aaiza Kalmar MRN: [id number] Date of Birth: 1970/08/01 Referring Provider: Dr. Venita Lick   Encounter Date: 10/31/2015      PT End of Session - 10/31/15 1304    Visit Number 12   Number of Visits 24   Date for PT Re-Evaluation 12/09/15   PT Start Time 1100   PT Stop Time 1211   PT Time Calculation (min) 71 min   Activity Tolerance Patient limited by pain      Past Medical History:  Diagnosis Date  . Anemia   . Chronic bronchitis (HCC)   . Headache    otc med prn, sleep  . History of kidney stones    passed stone - no surgery required  . 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 Seasonal allergies   . SI (sacroiliac) pain    ablations done - SI joint - no anesthesia, darvocet only  . SVD (spontaneous vaginal delivery)    x 4    Past Surgical History:  Procedure Laterality Date  . ANTERIOR AND POSTERIOR REPAIR WITH SACROSPINOUS FIXATION N/A 10/09/2014   Procedure: ANTERIOR AND POSTERIOR REPAIR WITH SACROSPINOUS LIGAMENT SUSPENSION;  Surgeon: Harold Hedge, MD;  Location: [facility];  Service: Gynecology;  Laterality: N/A;  . BILATERAL SALPINGECTOMY Bilateral 10/09/2014   Procedure: BILATERAL SALPINGECTOMY;  Surgeon: Harold Hedge, MD;  Location: [facility];  Service: Gynecology;  Laterality: Bilateral;  . DILITATION & CURRETTAGE/HYSTROSCOPY WITH NOVASURE ABLATION    . KNEE SURGERY     x 2 - left  . LAPAROSCOPIC ASSISTED VAGINAL HYSTERECTOMY N/A 10/09/2014   Procedure: LAPAROSCOPIC ASSISTED VAGINAL HYSTERECTOMY;  Surgeon: Harold Hedge, MD;  Location: [facility];  Service: Gynecology;  Laterality: N/A;  . NECK SURGERY     ? C4-C5  . ROTATOR CUFF REPAIR     left  . TUBAL LIGATION    . WISDOM TOOTH EXTRACTION      There were no vitals filed for this  visit.      Subjective Assessment - 10/31/15 1307    Subjective Pt ambulates into therapy without any assistive device but very antalgic gait.  Pt tearful at beginning of session.  She reports she has had increased pain in Lt hip (ant/post) and LE since Rt side ablasion.  She has had difficulty controlling pain.  She returns to surgeon tomorrow morning  and was originally hopeful to be released to return to work.    Currently in Pain? Yes   Pain Score 9    Pain Location Hip   Pain Orientation Left   Pain Descriptors / Indicators Sharp   Pain Radiating Towards both ant/post hip towards knee    Aggravating Factors  walking    Pain Relieving Factors TENS, ice, supported supine position.             [facility] PT Assessment - 10/31/15 0001      Assessment   Medical Diagnosis SI dysfunction    Referring Provider Dr. Venita Lick    Onset Date/Surgical Date 07/15/10   Hand Dominance Left   Next MD Visit 11/01/15          [facility] Adult PT Treatment/Exercise - 10/31/15 0001      Moist Heat Therapy   Number Minutes Moist Heat 20 Minutes   Moist Heat Location Hip  anterior Lt      Cryotherapy   Number Minutes Cryotherapy 20 Minutes   Cryotherapy Location Lumbar Spine   Type of Cryotherapy Ice pack     Electrical Stimulation   Electrical Stimulation Location Lt lateral distal thigh, ant prox Lt thigh, Lt posterior hip    Electrical Stimulation Action IFC   Electrical Stimulation Parameters  to tolerance    Electrical Stimulation Goals Tone;Pain     Manual Therapy   Manual Therapy Myofascial release;Soft tissue mobilization;Taping   Manual therapy comments Pt in supported hooklying position    Soft tissue mobilization TPR to Lt iliopsoas, Lt lateral thigh with oscilations of LLE.    Myofascial Release Lt lateral quad, Lt and Rt oblique, Lt iliopsoas    Kinesiotex Inhibit Muscle;Create Space  4 I strips of Rock tape applied to Lt hip      Kinesiotix   Create Space Lt hip to  decompress tissue for pain relief (Lt SI joint)    Inhibit Muscle  Lt lateral quad and anterior Lt hip.           PT Long Term Goals - 10/28/15 1031      PT LONG TERM GOAL #1   Title Improve trunk and LE mobility to functional level 12/09/15   Time 6   Period Weeks   Status Revised     PT LONG TERM GOAL #2   Title Normal gait pattern witoht assistive device and minimal pain over level and unlevel surfaces 12/09/15   Time 6   Period Weeks   Status Revised     PT LONG TERM GOAL #3   Title Improve functional activity level with patient to tolerate 30 min of sitting; 15 min of standing and 5-10 min of walking 12/09/15   Time 6   Period Weeks   Status Revised     PT LONG TERM GOAL #4   Title Independent in HEP 12/09/15   Time 6   Period Weeks   Status Revised     PT LONG TERM GOAL #5   Title Improve FOTO to </= 49% limitation 12/09/15   Time 6   Period Weeks   Status Revised               Plan - 10/31/15 1304    Clinical Impression Statement Pt had flare up of pain in Lt hip after ablasion of Rt SI joint.  Pt very point tender in Lt iliopsoas, ITB, glute med, lumbar paraspinals. Pt reported pain decrease to 4/10 with manual therapy followed by ice/heat and estim to Lt hip.     Rehab Potential Good   PT Frequency 2x / week   PT Duration 6 weeks   PT Treatment/Interventions Patient/family education;Cryotherapy;Electrical Stimulation;Iontophoresis /ml Dexamethasone;Moist Heat;Ultrasound;Neuromuscular re-education;Manual techniques;Dry needling;Therapeutic activities;Therapeutic exercise   PT Next Visit Plan Continue with stabilization and strengthening and TDN/manual work/modalities as indicated and tolerated.    PT Home Exercise Plan Pt to return to using single crutch since recent flare up.    Consulted and Agree with Plan of Care Patient      Patient will benefit from skilled therapeutic intervention in order to improve the following deficits and impairments:   Postural dysfunction, Improper body mechanics, Pain, Increased fascial restricitons, Increased muscle spasms, Abnormal gait, Decreased strength, Decreased endurance, Decreased activity tolerance  Visit Diagnosis: Weakness generalized  Myalgia  Other symptoms and signs involving the musculoskeletal system  Other abnormalities of gait and mobility     Problem List Patient  Active Problem List   Diagnosis Date Noted  . Pelvic relaxation 10/09/2014   Mayer Camel, PTA 10/31/15 1:16 PM  [facility] [address] [place] [address] [place], [place], [address] Phone: [phone]   Fax:  [phone]  Name: Everlie Eble MRN: [id number] Date of Birth: December 14, 1970

## 2015-11-04 ENCOUNTER — Encounter: Payer: Self-pay | Admitting: Rehabilitative and Restorative Service Providers"

## 2015-11-04 ENCOUNTER — Ambulatory Visit (INDEPENDENT_AMBULATORY_CARE_PROVIDER_SITE_OTHER): Payer: Worker's Compensation | Admitting: Rehabilitative and Restorative Service Providers"

## 2015-11-04 DIAGNOSIS — R531 Weakness: Secondary | ICD-10-CM | POA: Diagnosis not present

## 2015-11-04 DIAGNOSIS — R29898 Other symptoms and signs involving the musculoskeletal system: Secondary | ICD-10-CM | POA: Diagnosis not present

## 2015-11-04 DIAGNOSIS — R2689 Other abnormalities of gait and mobility: Secondary | ICD-10-CM

## 2015-11-04 DIAGNOSIS — M791 Myalgia, unspecified site: Secondary | ICD-10-CM

## 2015-11-04 NOTE — Patient Instructions (Signed)
   Quads / HF, Supine   Lie near edge of bed, pull both knees up toward chest. Hold one knee as you drop the other leg off the edge of the bed.  Relax hanging knee/can bend knee back if indicated. Hold 30 seconds. Repeat 3 times per session. Do 2-3 sessions per day.

## 2015-11-04 NOTE — Therapy (Signed)
[facility] [place] [address] [place] [address] [place], [place], [address] Phone: [phone]   Fax:  [phone]  Physical Therapy Treatment  Patient Details  Name: Chelsea Fritz MRN: [id number] Date of Birth: 03-Jun-1970 Referring Provider: Dr. Venita Lick   Encounter Date: 11/04/2015      PT End of Session - 11/04/15 1406    Visit Number 13   Number of Visits 24   Date for PT Re-Evaluation 12/09/15   PT Start Time 1400   PT Stop Time 1448   PT Time Calculation (min) 48 min   Activity Tolerance Patient tolerated treatment well      Past Medical History:  Diagnosis Date  . Anemia   . Chronic bronchitis (HCC)   . Headache    otc med prn, sleep  . History of kidney stones    passed stone - no surgery required  . 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 Seasonal allergies   . SI (sacroiliac) pain    ablations done - SI joint - no anesthesia, darvocet only  . SVD (spontaneous vaginal delivery)    x 4    Past Surgical History:  Procedure Laterality Date  . ANTERIOR AND POSTERIOR REPAIR WITH SACROSPINOUS FIXATION N/A 10/09/2014   Procedure: ANTERIOR AND POSTERIOR REPAIR WITH SACROSPINOUS LIGAMENT SUSPENSION;  Surgeon: Harold Hedge, MD;  Location: [facility];  Service: Gynecology;  Laterality: N/A;  . BILATERAL SALPINGECTOMY Bilateral 10/09/2014   Procedure: BILATERAL SALPINGECTOMY;  Surgeon: Harold Hedge, MD;  Location: [facility];  Service: Gynecology;  Laterality: Bilateral;  . DILITATION & CURRETTAGE/HYSTROSCOPY WITH NOVASURE ABLATION    . KNEE SURGERY     x 2 - left  . LAPAROSCOPIC ASSISTED VAGINAL HYSTERECTOMY N/A 10/09/2014   Procedure: LAPAROSCOPIC ASSISTED VAGINAL HYSTERECTOMY;  Surgeon: Harold Hedge, MD;  Location: [facility];  Service: Gynecology;  Laterality: N/A;  . NECK SURGERY     ? C4-C5  . ROTATOR CUFF REPAIR     left  . TUBAL LIGATION    . WISDOM TOOTH EXTRACTION      There were no vitals filed for  this visit.      Subjective Assessment - 11/04/15 1407    Subjective Much better this week. Started on steroids for pain following ablasion. Suppose to return to work Monday 11/11/15. Limited to no more than 15 pounds lifting and 5 pounds carrying  - recommending a rolling bookbag.  patient is concerned about lifting and carrying as well as negotiating steps. C/O tightness and pain in the upper back between her shoulder blades. Not sure why.   Acsending stairs discomfort in Lt anterior hip/groin area - descending stairs pain in the Lt hip/groin into the knee with WB in lowering    Currently in Pain? Yes   Pain Score 3    Pain Location Hip   Pain Orientation Left;Anterior  proximal thigh    Pain Descriptors / Indicators Tightness;Sharp   Pain Type Chronic pain                         [facility] Adult PT Treatment/Exercise - 11/04/15 0001      Self-Care   Self-Care --  lifting 5# wt waist to chest x 5; mid thih to waist x 5      Therapeutic Activites    Therapeutic Activities --  ascending/descending stairs 4" x 5; 6" x 5      Knee/Hip Exercises: Stretches  Hip Flexor Stretch Left;2 reps;60 seconds  PT assist/engaging core to avoid SI irritation     Moist Heat Therapy   Number Minutes Moist Heat 20 Minutes   Moist Heat Location Lumbar Spine;Hip  Lt anterior      Electrical Stimulation   Electrical Stimulation Location Lt lateral distal thigh, ant prox Lt thigh, Lt posterior hip    Electrical Stimulation Action IFC   Electrical Stimulation Parameters to tolerance   Electrical Stimulation Goals Tone;Pain                PT Education - 11/04/15 1432    Education provided Yes   Education Details HEP   Person(s) Educated Patient   Methods Explanation;Demonstration;Tactile cues;Verbal cues;Handout   Comprehension Verbalized understanding;Returned demonstration;Verbal cues required;Tactile cues required             PT Long Term Goals - 11/04/15 1607       PT LONG TERM GOAL #1   Title Improve trunk and LE mobility to functional level 12/09/15   Time 6   Period Weeks   Status On-going     PT LONG TERM GOAL #2   Title Normal gait pattern witoht assistive device and minimal pain over level and unlevel surfaces 12/09/15   Time 6   Period Weeks   Status On-going     PT LONG TERM GOAL #3   Title Improve functional activity level with patient to tolerate 30 min of sitting; 15 min of standing and 5-10 min of walking 12/09/15   Time 6   Period Weeks   Status On-going     PT LONG TERM GOAL #4   Title Independent in HEP 12/09/15   Time 6   Period Weeks   Status On-going     PT LONG TERM GOAL #5   Title Improve FOTO to </= 49% limitation 12/09/15   Time 6   Period Weeks   Status On-going               Plan - 11/04/15 1601    Clinical Impression Statement MD felt significant increase in pain following ablasion may have been due to pt lying on stomach for ~2 hours. She was placed on steroid dowe pac with immediate improvement. She is feeling much better this week. Concerned about the lifting and stairs she will encounter when she returns to work. She plans to start back to work Monday 11/11/15. Restricted to lifting no more than 15# and carrying no more than 5#. Patient started working on the lifting and carrying with 5# weight today with instruction in the proper lifting techniques. She worked on stairs as well with improvement in pain with stairs after stretch for hip flexors and quads(PT assisted). Will continue with cautious hip flexor stretch avoiding the assymetry through pelvis and SI area as much as possible. Continue to work toward goals of therapy.    Rehab Potential Good   PT Frequency 2x / week   PT Duration 6 weeks   PT Treatment/Interventions Patient/family education;Cryotherapy;Electrical Stimulation;Iontophoresis 4mg /ml Dexamethasone;Moist Heat;Ultrasound;Neuromuscular re-education;Manual techniques;Dry  needling;Therapeutic activities;Therapeutic exercise   PT Next Visit Plan Continue with stabilization and strengthening and TDN/manual work/modalities as indicated and tolerated. Focus on stairs/lift and carry next visit - stretch for hip flexors    PT Home Exercise Plan cautious hip flexor stretch    Consulted and Agree with Plan of Care Patient      Patient will benefit from skilled therapeutic intervention in order to improve the following deficits  and impairments:  Postural dysfunction, Improper body mechanics, Pain, Increased fascial restricitons, Increased muscle spasms, Abnormal gait, Decreased strength, Decreased endurance, Decreased activity tolerance  Visit Diagnosis: Weakness generalized  Myalgia  Other symptoms and signs involving the musculoskeletal system  Other abnormalities of gait and mobility     Problem List Patient Active Problem List   Diagnosis Date Noted  . Pelvic relaxation 10/09/2014    Amulya Quintin Rober MinionP Eniya Cannady PT, MPH  11/04/2015, 4:09 PM  [facility] [address] [place], [place], [address] Phone: [phone]   Fax:  [phone]  Name: Chelsea Risingara Aprea MRN: [id number] Date of Birth: Nov 16, 1970

## 2015-11-08 ENCOUNTER — Encounter: Payer: Self-pay | Admitting: Rehabilitative and Restorative Service Providers"

## 2015-11-08 ENCOUNTER — Ambulatory Visit (INDEPENDENT_AMBULATORY_CARE_PROVIDER_SITE_OTHER): Payer: Worker's Compensation | Admitting: Rehabilitative and Restorative Service Providers"

## 2015-11-08 DIAGNOSIS — R2689 Other abnormalities of gait and mobility: Secondary | ICD-10-CM

## 2015-11-08 DIAGNOSIS — M791 Myalgia, unspecified site: Secondary | ICD-10-CM

## 2015-11-08 DIAGNOSIS — R531 Weakness: Secondary | ICD-10-CM | POA: Diagnosis not present

## 2015-11-08 DIAGNOSIS — R29898 Other symptoms and signs involving the musculoskeletal system: Secondary | ICD-10-CM

## 2015-11-08 NOTE — Therapy (Signed)
[facility] [address] [place], [place], [address] Phone: [phone]   Fax:  [phone]  Physical Therapy Treatment  Patient Details  Name: Chelsea Risingara Haisley MRN: [id number] Date of Birth: 11-Oct-1970 Referring Provider: Dr. Venita Lickahari Brooks   Encounter Date: 11/08/2015      PT End of Session - 11/08/15 1510    Visit Number 14   Number of Visits 24   Date for PT Re-Evaluation 12/09/15   PT Start Time 1504   PT Stop Time 1600   PT Time Calculation (min) 56 min   Activity Tolerance Patient tolerated treatment well      Past Medical History:  Diagnosis Date  . Anemia   . Chronic bronchitis (HCC)   . Headache    otc med prn, sleep  . History of kidney stones    passed stone - no surgery required  . 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 Seasonal allergies   . SI (sacroiliac) pain    ablations done - SI joint - no anesthesia, darvocet only  . SVD (spontaneous vaginal delivery)    x 4    Past Surgical History:  Procedure Laterality Date  . ANTERIOR AND POSTERIOR REPAIR WITH SACROSPINOUS FIXATION N/A 10/09/2014   Procedure: ANTERIOR AND POSTERIOR REPAIR WITH SACROSPINOUS LIGAMENT SUSPENSION;  Surgeon: Harold HedgeJames Tomblin, MD;  Location: [facility];  Service: Gynecology;  Laterality: N/A;  . BILATERAL SALPINGECTOMY Bilateral 10/09/2014   Procedure: BILATERAL SALPINGECTOMY;  Surgeon: Harold HedgeJames Tomblin, MD;  Location: [facility];  Service: Gynecology;  Laterality: Bilateral;  . DILITATION & CURRETTAGE/HYSTROSCOPY WITH NOVASURE ABLATION    . KNEE SURGERY     x 2 - left  . LAPAROSCOPIC ASSISTED VAGINAL HYSTERECTOMY N/A 10/09/2014   Procedure: LAPAROSCOPIC ASSISTED VAGINAL HYSTERECTOMY;  Surgeon: Harold HedgeJames Tomblin, MD;  Location: [facility];  Service: Gynecology;  Laterality: N/A;  . NECK SURGERY     ? C4-C5  . ROTATOR CUFF REPAIR     left  . TUBAL LIGATION    . WISDOM TOOTH EXTRACTION      There were no vitals filed  for this visit.      Subjective Assessment - 11/08/15 1510    Subjective Much better after the stretching at last visit. Could go up and down steps with less pain and discomfort. Knee felt a lot better. Tried to have husband help her with the hip flexor stretch but her back hurt so she stopped. Anxious about return to work Monday. More confident now that she has gone over the rolling bag and lifting. Stairs do not hurt at all today after TDN and PT assisted stretch.    Pain Score 4    Pain Location Hip   Pain Orientation Left;Anterior   Pain Descriptors / Indicators Tightness;Sharp   Pain Type Chronic pain   Pain Onset More than a month ago   Pain Frequency Intermittent                         [facility] Adult PT Treatment/Exercise - 11/08/15 0001      Self-Care   Self-Care --  lifting rolling bag  with work supplies Higher education careers adviserfloor/chair; Personal assistantvan      Therapeutic Activites    Therapeutic Activities --  ascending/descending stairs 4" x 5; 6" x 5      Exercises   Exercises --  trial of hip flexor/quad stretch standing knee on chair  Knee/Hip Exercises: Stretches   Hip Flexor Stretch Left;2 reps;60 seconds  PT assist/engaging core to avoid SI irritation/pt I x 1      Moist Heat Therapy   Number Minutes Moist Heat 20 Minutes   Moist Heat Location Lumbar Spine;Hip  Lt anterior      Electrical Stimulation   Electrical Stimulation Location Lt lateral distal thigh, ant prox Lt thigh, Lt posterior hip    Electrical Stimulation Action IFC   Electrical Stimulation Parameters to tolerance   Electrical Stimulation Goals Tone;Pain          Trigger Point Dry Needling - 11/08/15 1603    Consent Given? Yes   Tensor Fascia Lata Response Twitch response elicited;Palpable increased muscle length   Quadriceps Response Twitch response elicited;Palpable increased muscle length  proximal quads               PT Education - 11/08/15 1605    Education provided Yes   Education  Details lifting rolling bag floor to chair/waist height and lifting in and out of her minivan - back seat and trunk; work on stretch for hip flexors and quad safely for pt to continue at home    Person(s) Educated Patient   Methods Explanation;Demonstration;Tactile cues;Verbal cues   Comprehension Verbalized understanding;Returned demonstration;Verbal cues required;Tactile cues required             PT Long Term Goals - 11/04/15 1607      PT LONG TERM GOAL #1   Title Improve trunk and LE mobility to functional level 12/09/15   Time 6   Period Weeks   Status On-going     PT LONG TERM GOAL #2   Title Normal gait pattern witoht assistive device and minimal pain over level and unlevel surfaces 12/09/15   Time 6   Period Weeks   Status On-going     PT LONG TERM GOAL #3   Title Improve functional activity level with patient to tolerate 30 min of sitting; 15 min of standing and 5-10 min of walking 12/09/15   Time 6   Period Weeks   Status On-going     PT LONG TERM GOAL #4   Title Independent in HEP 12/09/15   Time 6   Period Weeks   Status On-going     PT LONG TERM GOAL #5   Title Improve FOTO to </= 49% limitation 12/09/15   Time 6   Period Weeks   Status On-going               Plan - 11/08/15 1607    Clinical Impression Statement Excellent response to assisted stretching for hip flexors/quads with significant improvement in the pain and discomfort in the anterior thigh and knee and great improvement in ascending and descending stairs with less pain. Good demo of lifting rolling bag for work. Gradually progressing with rehab.    Rehab Potential Good   PT Frequency 2x / week   PT Duration 6 weeks   PT Treatment/Interventions Patient/family education;Cryotherapy;Electrical Stimulation;Iontophoresis /ml Dexamethasone;Moist Heat;Ultrasound;Neuromuscular re-education;Manual techniques;Dry needling;Therapeutic activities;Therapeutic exercise   PT Next Visit Plan Continue  with stabilization and strengthening and TDN/manual work/modalities as indicated and tolerated. Focus on stairs/lift and carry next visit - stretch for hip flexors    PT Home Exercise Plan cautious hip flexor stretch    Consulted and Agree with Plan of Care Patient      Patient will benefit from skilled therapeutic intervention in order to improve the following deficits and impairments:  Postural dysfunction, Improper body mechanics, Pain, Increased fascial restricitons, Increased muscle spasms, Abnormal gait, Decreased strength, Decreased endurance, Decreased activity tolerance  Visit Diagnosis: Weakness generalized  Myalgia  Other abnormalities of gait and mobility  Other symptoms and signs involving the musculoskeletal system     Problem List Patient Active Problem List   Diagnosis Date Noted  . Pelvic relaxation 10/09/2014    Celyn Rober Minion PT, MPH  11/08/2015, 4:10 PM  [facility] [address] [place], [place], [address] Phone: [phone]   Fax:  [phone]  Name: Chelsea Fritz MRN: [id number] Date of Birth: 1970/06/03

## 2015-11-11 ENCOUNTER — Encounter: Payer: Self-pay | Admitting: Rehabilitative and Restorative Service Providers"

## 2015-11-11 ENCOUNTER — Ambulatory Visit (INDEPENDENT_AMBULATORY_CARE_PROVIDER_SITE_OTHER): Payer: Worker's Compensation | Admitting: Rehabilitative and Restorative Service Providers"

## 2015-11-11 DIAGNOSIS — R531 Weakness: Secondary | ICD-10-CM

## 2015-11-11 DIAGNOSIS — M791 Myalgia, unspecified site: Secondary | ICD-10-CM

## 2015-11-11 DIAGNOSIS — R29898 Other symptoms and signs involving the musculoskeletal system: Secondary | ICD-10-CM

## 2015-11-11 DIAGNOSIS — R2689 Other abnormalities of gait and mobility: Secondary | ICD-10-CM | POA: Diagnosis not present

## 2015-11-11 NOTE — Therapy (Signed)
[facility] [place] [address] [place] [place], [place], [address] Phone: [phone]   Fax:  [phone]  Physical Therapy Treatment  Patient Details  Name: Chelsea Fritz MRN: [id number] Date of Birth: 07-03-1970 Referring Provider: Dr. Venita Lick   Encounter Date: 11/11/2015      PT End of Session - 11/11/15 1515    Visit Number 15   Number of Visits 24   Date for PT Re-Evaluation 12/09/15   PT Start Time 1515   PT Stop Time 1609   PT Time Calculation (min) 54 min      Past Medical History:  Diagnosis Date  . Anemia   . Chronic bronchitis (HCC)   . Headache    otc med prn, sleep  . History of kidney stones    passed stone - no surgery required  . 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 Seasonal allergies   . SI (sacroiliac) pain    ablations done - SI joint - no anesthesia, darvocet only  . SVD (spontaneous vaginal delivery)    x 4    Past Surgical History:  Procedure Laterality Date  . ANTERIOR AND POSTERIOR REPAIR WITH SACROSPINOUS FIXATION N/A 10/09/2014   Procedure: ANTERIOR AND POSTERIOR REPAIR WITH SACROSPINOUS LIGAMENT SUSPENSION;  Surgeon: Harold Hedge, MD;  Location: [facility];  Service: Gynecology;  Laterality: N/A;  . BILATERAL SALPINGECTOMY Bilateral 10/09/2014   Procedure: BILATERAL SALPINGECTOMY;  Surgeon: Harold Hedge, MD;  Location: [facility];  Service: Gynecology;  Laterality: Bilateral;  . DILITATION & CURRETTAGE/HYSTROSCOPY WITH NOVASURE ABLATION    . KNEE SURGERY     x 2 - left  . LAPAROSCOPIC ASSISTED VAGINAL HYSTERECTOMY N/A 10/09/2014   Procedure: LAPAROSCOPIC ASSISTED VAGINAL HYSTERECTOMY;  Surgeon: Harold Hedge, MD;  Location: [facility];  Service: Gynecology;  Laterality: N/A;  . NECK SURGERY     ? C4-C5  . ROTATOR CUFF REPAIR     left  . TUBAL LIGATION    . WISDOM TOOTH EXTRACTION      There were no vitals filed for this visit.      Subjective Assessment - 11/11/15  1517    Subjective Worked today - mostly in meetings all day. she was sitting for several hours. she did get up and stand and move about some. She did not have to carry anything today. She used the estim (TENS on and off all day today) Used heat on and off with heated seat and heating pad in office. Able to get daughter to help with hip flexor stretch and that made a lot of difference in how steps feel. She noticed that she had to do a lot of walking and feels she will do better with handicap parking.    Currently in Pain? Yes   Pain Score 5    Pain Location Hip   Pain Orientation Left;Anterior;Posterior   Pain Descriptors / Indicators Tiring;Tightness;Aching   Pain Type Chronic pain   Pain Frequency Intermittent                         [facility] Adult PT Treatment/Exercise - 11/11/15 0001      Lumbar Exercises: Aerobic   Stationary Bike Nustep L4 x 6 min      Knee/Hip Exercises: Stretches   Hip Flexor Stretch Left;2 reps;60 seconds  PT assist/engaging core to avoid SI irritation/pt I x 1    Other Knee/Hip Stretches hip adductor stretch with PT  assist x30 sec hold x 3 reps      Moist Heat Therapy   Number Minutes Moist Heat 20 Minutes   Moist Heat Location Lumbar Spine;Hip  Lt anterior      Electrical Stimulation   Electrical Stimulation Location Lt lateral distal thigh, ant prox Lt thigh, Lt posterior hip    Electrical Stimulation Action IFC   Electrical Stimulation Parameters to tolerance   Electrical Stimulation Goals Tone;Pain     Ultrasound   Ultrasound Location anterior Lt thigh and hip adductors    Ultrasound Parameters 1.5 w/cm2; 1 mHz; 100% 8 min    Ultrasound Goals Pain;Other (Comment)  tightness      Manual Therapy   Manual Therapy Myofascial release;Soft tissue mobilization;Taping   Manual therapy comments Pt in supported hooklying position    Soft tissue mobilization hip flexors; adductors   Myofascial Release Lt hip/quad area                  PT Education - 11/11/15 1702    Education provided Yes   Education Details positions for sitting; walking; rest breaks; activity level with RTW    Person(s) Educated Patient   Methods Explanation   Comprehension Verbalized understanding             PT Long Term Goals - 11/11/15 1514      PT LONG TERM GOAL #1   Title Improve trunk and LE mobility to functional level 12/09/15   Time 6   Period Weeks   Status On-going     PT LONG TERM GOAL #2   Title Normal gait pattern witoht assistive device and minimal pain over level and unlevel surfaces 12/09/15   Time 6   Period Weeks   Status On-going     PT LONG TERM GOAL #3   Title Improve functional activity level with patient to tolerate 30 min of sitting; 15 min of standing and 5-10 min of walking 12/09/15   Time 6   Period Weeks   Status On-going     PT LONG TERM GOAL #4   Title Independent in HEP 12/09/15   Time 6   Period Weeks   Status On-going     PT LONG TERM GOAL #5   Title Improve FOTO to </= 49% limitation 12/09/15   Time 6   Period Weeks   Status On-going               Plan - 11/11/15 1703    Clinical Impression Statement Faitgue reported today after first day back to work. Discomfort reported through the LB and Lt thigh/hip area. Noted significant tightness through the anterior hip/hip flexors and hip adductors on Lt. Good response to intervention. Patient continues to progress and tolerated RTW fairly well today.    Rehab Potential Good   PT Frequency 2x / week   PT Duration 6 weeks   PT Treatment/Interventions Patient/family education;Cryotherapy;Electrical Stimulation;Iontophoresis 4mg /ml Dexamethasone;Moist Heat;Ultrasound;Neuromuscular re-education;Manual techniques;Dry needling;Therapeutic activities;Therapeutic exercise   PT Next Visit Plan Continue with stabilization and strengthening and TDN/manual work/modalities as indicated and tolerated. Focus on stairs/lift and carry next visit - stretch  for hip flexors    Consulted and Agree with Plan of Care Patient      Patient will benefit from skilled therapeutic intervention in order to improve the following deficits and impairments:  Postural dysfunction, Improper body mechanics, Pain, Increased fascial restricitons, Increased muscle spasms, Abnormal gait, Decreased strength, Decreased endurance, Decreased activity tolerance  Visit Diagnosis: Weakness generalized  Myalgia  Other abnormalities of gait and mobility  Other symptoms and signs involving the musculoskeletal system     Problem List Patient Active Problem List   Diagnosis Date Noted  . Pelvic relaxation 10/09/2014    Robbert Langlinais Rober MinionP Sabryn Preslar PT, MPH 11/11/2015, 5:06 PM  [facility] [address] [place] [address] [place], [place], [address] Phone: [phone]   Fax:  [phone]  Name: Chelsea Fritz MRN: [id number] Date of Birth: [birth year]

## 2015-11-13 ENCOUNTER — Encounter: Payer: Self-pay | Admitting: Physical Therapy

## 2015-11-14 ENCOUNTER — Encounter: Payer: Self-pay | Admitting: Physical Therapy

## 2015-11-14 ENCOUNTER — Ambulatory Visit (INDEPENDENT_AMBULATORY_CARE_PROVIDER_SITE_OTHER): Payer: BC Managed Care – PPO | Admitting: Physical Therapy

## 2015-11-14 DIAGNOSIS — M791 Myalgia, unspecified site: Secondary | ICD-10-CM

## 2015-11-14 DIAGNOSIS — R531 Weakness: Secondary | ICD-10-CM

## 2015-11-14 DIAGNOSIS — R2689 Other abnormalities of gait and mobility: Secondary | ICD-10-CM | POA: Diagnosis not present

## 2015-11-14 DIAGNOSIS — R29898 Other symptoms and signs involving the musculoskeletal system: Secondary | ICD-10-CM

## 2015-11-14 NOTE — Therapy (Signed)
[facility] [place] [address] [place], [place], [address] Phone: [phone]   Fax:  [phone]  Physical Therapy Treatment  Patient Details  Name: Chelsea Fritz MRN: [id number] Date of Birth: December 14, 1970 Referring Provider: Dr. Venita Lick   Encounter Date: 11/14/2015      PT End of Session - 11/14/15 1454    Visit Number 16   Number of Visits 24   Date for PT Re-Evaluation 12/09/15   PT Start Time 1454   PT Stop Time 1552   PT Time Calculation (min) 58 min      Past Medical History:  Diagnosis Date  . Anemia   . Chronic bronchitis (HCC)   . Headache    otc med prn, sleep  . History of kidney stones    passed stone - no surgery required  . 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 Seasonal allergies   . SI (sacroiliac) pain    ablations done - SI joint - no anesthesia, darvocet only  . SVD (spontaneous vaginal delivery)    x 4    Past Surgical History:  Procedure Laterality Date  . ANTERIOR AND POSTERIOR REPAIR WITH SACROSPINOUS FIXATION N/A 10/09/2014   Procedure: ANTERIOR AND POSTERIOR REPAIR WITH SACROSPINOUS LIGAMENT SUSPENSION;  Surgeon: Harold Hedge, MD;  Location: [facility];  Service: Gynecology;  Laterality: N/A;  . BILATERAL SALPINGECTOMY Bilateral 10/09/2014   Procedure: BILATERAL SALPINGECTOMY;  Surgeon: Harold Hedge, MD;  Location: [facility];  Service: Gynecology;  Laterality: Bilateral;  . DILITATION & CURRETTAGE/HYSTROSCOPY WITH NOVASURE ABLATION    . KNEE SURGERY     x 2 - left  . LAPAROSCOPIC ASSISTED VAGINAL HYSTERECTOMY N/A 10/09/2014   Procedure: LAPAROSCOPIC ASSISTED VAGINAL HYSTERECTOMY;  Surgeon: Harold Hedge, MD;  Location: [facility];  Service: Gynecology;  Laterality: N/A;  . NECK SURGERY     ? C4-C5  . ROTATOR CUFF REPAIR     left  . TUBAL LIGATION    . WISDOM TOOTH EXTRACTION      There were no vitals filed for this visit.      Subjective Assessment - 11/14/15  1454    Subjective Pt totally forgot about yesterdays visit, was busy in meetings. Has not been performing her HEP as much as she was before returning to work. is trying to do things through out the day.    Currently in Pain? Yes   Pain Score 5    Pain Location Back   Pain Orientation Left;Right;Lower   Pain Descriptors / Indicators Aching;Dull   Pain Type Chronic pain   Pain Radiating Towards belt level   Pain Onset Yesterday   Pain Frequency Constant   Aggravating Factors  having a lot more activity since going back to work, walking.    Pain Relieving Factors TENs, heat                         [facility] Adult PT Treatment/Exercise - 11/14/15 0001      Lumbar Exercises: Stretches   Double Knee to Chest Stretch 30 seconds   Pelvic Tilt --  hip flexion stretch Rt with leg off EOB, PT stabilizing pelv   ITB Stretch 30 seconds  standing     Lumbar Exercises: Aerobic   Stationary Bike Nustep L4 x 6 min      Lumbar Exercises: Supine   Other Supine Lumbar Exercises 10 reps leg lengtheners Rt     Modalities  Modalities Electrical Stimulation;Moist Heat     Moist Heat Therapy   Number Minutes Moist Heat 15 Minutes   Moist Heat Location Lumbar Spine;Hip  ant Lt hip     Electrical Stimulation   Electrical Stimulation Location lumbar and Rt buttock   Electrical Stimulation Action IFC   Electrical Stimulation Parameters to tolerance   Electrical Stimulation Goals Tone;Pain     Manual Therapy   Manual Therapy Soft tissue mobilization   Soft tissue mobilization lumbar paraspinals bilat gluts          Trigger Point Dry Needling - 11/14/15 1542    Consent Given? Yes   Education Handout Provided No   Muscles Treated Upper Body Longissimus;Gluteus minimus;Gluteus maximus;Piriformis  all rt side   Longissimus Response Twitch response elicited;Palpable increased muscle length   Gluteus Maximus Response Palpable increased muscle length;Twitch response elicited    Gluteus Minimus Response Palpable increased muscle length;Twitch response elicited   Piriformis Response Twitch response elicited  more painful for patient              PT Education - 11/14/15 1548    Education provided Yes   Education Details ITB stretch standing   Person(s) Educated Patient   Methods Explanation   Comprehension Returned demonstration;Verbalized understanding             PT Long Term Goals - 11/11/15 1514      PT LONG TERM GOAL #1   Title Improve trunk and LE mobility to functional level 12/09/15   Time 6   Period Weeks   Status On-going     PT LONG TERM GOAL #2   Title Normal gait pattern witoht assistive device and minimal pain over level and unlevel surfaces 12/09/15   Time 6   Period Weeks   Status On-going     PT LONG TERM GOAL #3   Title Improve functional activity level with patient to tolerate 30 min of sitting; 15 min of standing and 5-10 min of walking 12/09/15   Time 6   Period Weeks   Status On-going     PT LONG TERM GOAL #4   Title Independent in HEP 12/09/15   Time 6   Period Weeks   Status On-going     PT LONG TERM GOAL #5   Title Improve FOTO to </= 49% limitation 12/09/15   Time 6   Period Weeks   Status On-going               Plan - 11/14/15 1546    Clinical Impression Statement Chelsea Fritz is having less pain today than on Monday, feels really bad about missing yesterdays treatment, she is fatigued and sore with the increased activity.    Rehab Potential Good   PT Frequency 2x / week   PT Duration 6 weeks   PT Treatment/Interventions Patient/family education;Cryotherapy;Electrical Stimulation;Iontophoresis 4mg /ml Dexamethasone;Moist Heat;Ultrasound;Neuromuscular re-education;Manual techniques;Dry needling;Therapeutic activities;Therapeutic exercise   PT Next Visit Plan see how she is doing after full week at work, and see how ITB stretch is helping.    Consulted and Agree with Plan of Care Patient      Patient will  benefit from skilled therapeutic intervention in order to improve the following deficits and impairments:  Postural dysfunction, Improper body mechanics, Pain, Increased fascial restricitons, Increased muscle spasms, Abnormal gait, Decreased strength, Decreased endurance, Decreased activity tolerance  Visit Diagnosis: Weakness generalized  Myalgia  Other abnormalities of gait and mobility  Other symptoms and signs involving the musculoskeletal system  Problem List Patient Active Problem List   Diagnosis Date Noted  . Pelvic relaxation 10/09/2014    Roderic ScarceSusan Tammera Engert  11/14/2015, 3:49 PM  [facility] [address] [address] [place], [place], [address] Phone: [phone]   Fax:  [phone]  Name: Chelsea Fritz MRN: [id number] Date of Birth: [birth year]

## 2015-11-14 NOTE — Patient Instructions (Signed)
Iliotibial Band Stretch    Stand with left hip _12-16__ inches from wall. Cross other leg in front and use it and arm for support. Lean toward wall until a stretch is felt on outside of hip near wall. Keep that leg straight. Hold __30-45__ seconds. Repeat 1____ times. Do _1___ sessions per day.  [email or web address].[email or web address]   Copyright  VHI. All rights reserved.

## 2015-11-18 ENCOUNTER — Ambulatory Visit (INDEPENDENT_AMBULATORY_CARE_PROVIDER_SITE_OTHER): Payer: Worker's Compensation | Admitting: Physical Therapy

## 2015-11-18 DIAGNOSIS — R2689 Other abnormalities of gait and mobility: Secondary | ICD-10-CM

## 2015-11-18 DIAGNOSIS — M791 Myalgia, unspecified site: Secondary | ICD-10-CM

## 2015-11-18 DIAGNOSIS — R531 Weakness: Secondary | ICD-10-CM

## 2015-11-18 DIAGNOSIS — R29898 Other symptoms and signs involving the musculoskeletal system: Secondary | ICD-10-CM

## 2015-11-18 NOTE — Therapy (Signed)
[facility] [address] [place], [place], [address] Phone: [phone]   Fax:  [phone]  Physical Therapy Treatment  Patient Details  Name: Chelsea Fritz MRN: [id number] Date of Birth: 1970/05/06 Referring Provider: Dr. Venita Lickahari Brooks   Encounter Date: 11/18/2015      PT End of Session - 11/18/15 1636    Visit Number 17   Number of Visits 24   Date for PT Re-Evaluation 12/09/15   PT Start Time 1520   PT Stop Time 1625   PT Time Calculation (min) 65 min   Activity Tolerance Patient tolerated treatment well      Past Medical History:  Diagnosis Date  . Anemia   . Chronic bronchitis (HCC)   . Headache    otc med prn, sleep  . History of kidney stones    passed stone - no surgery required  . 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 Seasonal allergies   . SI (sacroiliac) pain    ablations done - SI joint - no anesthesia, darvocet only  . SVD (spontaneous vaginal delivery)    x 4    Past Surgical History:  Procedure Laterality Date  . ANTERIOR AND POSTERIOR REPAIR WITH SACROSPINOUS FIXATION N/A 10/09/2014   Procedure: ANTERIOR AND POSTERIOR REPAIR WITH SACROSPINOUS LIGAMENT SUSPENSION;  Surgeon: Harold HedgeJames Tomblin, MD;  Location: [facility];  Service: Gynecology;  Laterality: N/A;  . BILATERAL SALPINGECTOMY Bilateral 10/09/2014   Procedure: BILATERAL SALPINGECTOMY;  Surgeon: Harold HedgeJames Tomblin, MD;  Location: [facility];  Service: Gynecology;  Laterality: Bilateral;  . DILITATION & CURRETTAGE/HYSTROSCOPY WITH NOVASURE ABLATION    . KNEE SURGERY     x 2 - left  . LAPAROSCOPIC ASSISTED VAGINAL HYSTERECTOMY N/A 10/09/2014   Procedure: LAPAROSCOPIC ASSISTED VAGINAL HYSTERECTOMY;  Surgeon: Harold HedgeJames Tomblin, MD;  Location: [facility];  Service: Gynecology;  Laterality: N/A;  . NECK SURGERY     ? C4-C5  . ROTATOR CUFF REPAIR     left  . TUBAL LIGATION    . WISDOM TOOTH EXTRACTION      There were no vitals filed  for this visit.      Subjective Assessment - 11/18/15 1520    Subjective Pt reports she was in the pool over the weekend, "3 x yesterday".  She has taken it easy and she feels it has helped calm her back down.  Today her Lt hamstring is cramping.  Overall, she is pleased with her progress.    Currently in Pain? Yes   Pain Score 3    Pain Location Back   Pain Orientation Left;Right;Lower   Pain Descriptors / Indicators Aching;Dull   Aggravating Factors  sitting.    Pain Relieving Factors TENS, heat.             [facility] - 11/18/15 0001      Flexibility   Hamstrings 97 deg LLE          [facility] Adult PT Treatment/Exercise - 11/18/15 0001      Lumbar Exercises: Stretches   Passive Hamstring Stretch 3 reps;30 seconds  LLE   Single Knee to Chest Stretch 2 reps;30 seconds   ITB Stretch 30 seconds  standing.      Lumbar Exercises: Aerobic   Stationary Bike Nustep L4 x 6 min      Knee/Hip Exercises: Standing   Other Standing Knee Exercises Split squats (mini) x 5 reps each side (with UE support) x 2 sets.  mini squats and heel raise with bilat shoulder flexion to full range, core engaged x 10 reps    Other Standing Knee Exercises Standing hip flexor stretch x 30 sec x 3 reps.      Moist Heat Therapy   Number Minutes Moist Heat 15 Minutes   Moist Heat Location Lumbar Spine     Electrical Stimulation   Electrical Stimulation Location Lumbar paraspinals, ant/ posterior Lt hip    Electrical Stimulation Action IFC   Electrical Stimulation Parameters to tolerance    Electrical Stimulation Goals Tone;Pain     Manual Therapy   Manual Therapy Myofascial release;Soft tissue mobilization   Manual therapy comments Pt in supported prone position   Soft tissue mobilization TPR to Lt glute/ hip rotators/ prox ITB/ lateral and mid hamstring   Myofascial Release to Lt hamstring.                 PT Education - 11/18/15 1634    Education provided Yes   Education  Details HEP   Person(s) Educated Patient   Methods Explanation;Handout   Comprehension Verbalized understanding;Returned demonstration             PT Long Term Goals - 11/11/15 1514      PT LONG TERM GOAL #1   Title Improve trunk and LE mobility to functional level 12/09/15   Time 6   Period Weeks   Status On-going     PT LONG TERM GOAL #2   Title Normal gait pattern witoht assistive device and minimal pain over level and unlevel surfaces 12/09/15   Time 6   Period Weeks   Status On-going     PT LONG TERM GOAL #3   Title Improve functional activity level with patient to tolerate 30 min of sitting; 15 min of standing and 5-10 min of walking 12/09/15   Time 6   Period Weeks   Status On-going     PT LONG TERM GOAL #4   Title Independent in HEP 12/09/15   Time 6   Period Weeks   Status On-going     PT LONG TERM GOAL #5   Title Improve FOTO to </= 49% limitation 12/09/15   Time 6   Period Weeks   Status On-going               Plan - 11/18/15 1637    Clinical Impression Statement Pt tolerated all exercises well, with minimal increase in symptoms. [name] was initially very point tender with manual work to her Lt glute and hamstring; decreased with slow TPR/ MFR.  Pt progressing towards goals with improved sitting tolerance to 25 min.    Rehab Potential Good   PT Frequency 2x / week   PT Duration 6 weeks   PT Treatment/Interventions Patient/family education;Cryotherapy;Electrical Stimulation;Iontophoresis 4mg /ml Dexamethasone;Moist Heat;Ultrasound;Neuromuscular re-education;Manual techniques;Dry needling;Therapeutic activities;Therapeutic exercise   PT Next Visit Plan Assess tolerance to new exercises and work load.  Trial gait on unlevel surface.    Consulted and Agree with Plan of Care Patient      Patient will benefit from skilled therapeutic intervention in order to improve the following deficits and impairments:  Postural dysfunction, Improper body mechanics,  Pain, Increased fascial restricitons, Increased muscle spasms, Abnormal gait, Decreased strength, Decreased endurance, Decreased activity tolerance  Visit Diagnosis: Weakness generalized  Myalgia  Other abnormalities of gait and mobility  Other symptoms and signs involving the musculoskeletal system     Problem List Patient Active Problem List   Diagnosis Date  Noted  . Pelvic relaxation 10/09/2014   Mayer CamelJennifer Carlson-Long, PTA 11/18/15 4:47 PM  [facility] [address] [place], [place], [address] Phone: [phone]   Fax:  [phone]  Name: Chelsea Fritz MRN: [id number] Date of Birth: [birth year]

## 2015-11-18 NOTE — Patient Instructions (Addendum)
Squat: Back Split    In wide stride-stance, hold NO weight on back of shoulders. Trunk stable, chest and head up, squat straight down. Do not lunge. Front knee over (not ahead of) front foot. Hold momentarily. Return slowly. Repeat _5__ times. Flexors, Standing: Advanced    Stand on one leg, other leg behind on chair. Slowly drop pelvis until stretch is felt on front of hip on supported side. Hold __15-30_ seconds.  Repeat _2-3__ times per session.   [facility] [address] [place], [place] [address]  [phone] (office) [phone] (fax)

## 2015-11-22 ENCOUNTER — Encounter: Payer: Self-pay | Admitting: Rehabilitative and Restorative Service Providers"

## 2015-11-22 ENCOUNTER — Ambulatory Visit (INDEPENDENT_AMBULATORY_CARE_PROVIDER_SITE_OTHER): Payer: Worker's Compensation | Admitting: Rehabilitative and Restorative Service Providers"

## 2015-11-22 DIAGNOSIS — R2689 Other abnormalities of gait and mobility: Secondary | ICD-10-CM

## 2015-11-22 DIAGNOSIS — M791 Myalgia, unspecified site: Secondary | ICD-10-CM

## 2015-11-22 DIAGNOSIS — R29898 Other symptoms and signs involving the musculoskeletal system: Secondary | ICD-10-CM | POA: Diagnosis not present

## 2015-11-22 DIAGNOSIS — R531 Weakness: Secondary | ICD-10-CM | POA: Diagnosis not present

## 2015-11-22 NOTE — Therapy (Signed)
[facility] [place] [address] [place], [place], [address] Phone: [phone]   Fax:  [phone]  Physical Therapy Treatment  Patient Details  Name: Pati Thinnes MRN: [id number] Date of Birth: [birth year] Referring Provider: Dr. Venita Lick   Encounter Date: 11/22/2015      PT End of Session - 11/22/15 1450    Visit Number 18   Number of Visits 24   Date for PT Re-Evaluation 12/09/15   PT Start Time 1445   PT Stop Time 1542   PT Time Calculation (min) 57 min   Activity Tolerance Patient tolerated treatment well      Past Medical History:  Diagnosis Date  . Anemia   . Chronic bronchitis (HCC)   . Headache    otc med prn, sleep  . History of kidney stones    passed stone - no surgery required  . 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 Seasonal allergies   . SI (sacroiliac) pain    ablations done - SI joint - no anesthesia, darvocet only  . SVD (spontaneous vaginal delivery)    x 4    Past Surgical History:  Procedure Laterality Date  . ANTERIOR AND POSTERIOR REPAIR WITH SACROSPINOUS FIXATION N/A 10/09/2014   Procedure: ANTERIOR AND POSTERIOR REPAIR WITH SACROSPINOUS LIGAMENT SUSPENSION;  Surgeon: Harold Hedge, MD;  Location: [facility];  Service: Gynecology;  Laterality: N/A;  . BILATERAL SALPINGECTOMY Bilateral 10/09/2014   Procedure: BILATERAL SALPINGECTOMY;  Surgeon: Harold Hedge, MD;  Location: [facility];  Service: Gynecology;  Laterality: Bilateral;  . DILITATION & CURRETTAGE/HYSTROSCOPY WITH NOVASURE ABLATION    . KNEE SURGERY     x 2 - left  . LAPAROSCOPIC ASSISTED VAGINAL HYSTERECTOMY N/A 10/09/2014   Procedure: LAPAROSCOPIC ASSISTED VAGINAL HYSTERECTOMY;  Surgeon: Harold Hedge, MD;  Location: [facility];  Service: Gynecology;  Laterality: N/A;  . NECK SURGERY     ? C4-C5  . ROTATOR CUFF REPAIR     left  . TUBAL LIGATION    . WISDOM TOOTH EXTRACTION      There were no vitals filed  for this visit.      Subjective Assessment - 11/22/15 1451    Subjective Exhausted - long hard week at work. she does feel the Lt LE musculature is getting better. She is not sitting through the pain in the better. Stairs are painful and she had to do a lot of stairs yesterday. Proir to yesterday she was doing well with the stiars.    Currently in Pain? Yes   Pain Score 5    Pain Location Back   Pain Orientation Lower;Left;Right   Pain Descriptors / Indicators Aching;Dull   Pain Radiating Towards some spasm in the Lt hamstring today                          [facility] Adult PT Treatment/Exercise - 11/22/15 0001      Lumbar Exercises: Aerobic   Stationary Bike Nustep L4 x 5 min      Lumbar Exercises: Supine   Ab Set --  3 part core 10 sec x 10      Knee/Hip Exercises: Stretches   Hip Flexor Stretch Left;2 reps;60 seconds  PT assist     Moist Heat Therapy   Number Minutes Moist Heat 20 Minutes   Moist Heat Location Lumbar Spine;Hip  anterior hips      Electrical Stimulation   Electrical  Stimulation Location Lumbar paraspinals, posterior Lt/Rt hip    Electrical Stimulation Action IFC   Electrical Stimulation Parameters to tolerance   Electrical Stimulation Goals Tone;Pain     Ultrasound   Ultrasound Location bilat Lt > Rt piriformis/glut med/max areas   Ultrasound Parameters 1.5 w/cm@; 100%; 1 mHz; 10 min    Ultrasound Goals Pain;Other (Comment)  tightness     Manual Therapy   Manual Therapy Myofascial release;Soft tissue mobilization   Manual therapy comments Pt in supported prone position - pillow under abdomen   Soft tissue mobilization soft tissue with deeper pressure as tolerated through lumbalat paraspinals, hip; SI; buttocks   Myofascial Release myofacial release through lumbar spine and hips/buttocks                      PT Long Term Goals - 11/22/15 1627      PT LONG TERM GOAL #1   Title Improve trunk and LE mobility to functional  level 12/09/15   Time 6   Period Weeks   Status On-going     PT LONG TERM GOAL #2   Title Normal gait pattern witoht assistive device and minimal pain over level and unlevel surfaces 12/09/15   Time 6   Period Weeks   Status On-going     PT LONG TERM GOAL #3   Title Improve functional activity level with patient to tolerate 30 min of sitting; 15 min of standing and 5-10 min of walking 12/09/15   Time 6   Period Weeks   Status On-going     PT LONG TERM GOAL #4   Title Independent in HEP 12/09/15   Time 6   Period Weeks   Status On-going     PT LONG TERM GOAL #5   Title Improve FOTO to </= 49% limitation 12/09/15   Time 6   Period Weeks   Status On-going               Plan - 11/22/15 1622    Clinical Impression Statement Increased pain and dsicomfort today - last of a very full week at work and home with the beginning of school next week. Pt was much more active in the past week, especially in the past two days. She has managed but has had to take more medication. Hopes to rest and get into the pool this weekend(has not exercised in the water since Tuesday). Good response to modalities and manual work with significant decrease in pain. Patient is progressing well with return to work. Will address symptoms before progressing with exercise to avoid persistent or increased flare up of symptoms.    Rehab Potential Good   PT Frequency 2x / week   PT Duration 6 weeks   PT Treatment/Interventions Patient/family education;Cryotherapy;Electrical Stimulation;Iontophoresis 4mg /ml Dexamethasone;Moist Heat;Ultrasound;Neuromuscular re-education;Manual techniques;Dry needling;Therapeutic activities;Therapeutic exercise   PT Next Visit Plan Assess response to return to work and progress exercise and gait as tolerated. Patient has been walking some on unlevel surfaces and negotiating stairs.    PT Home Exercise Plan water exercise    Consulted and Agree with Plan of Care Patient       Patient will benefit from skilled therapeutic intervention in order to improve the following deficits and impairments:  Postural dysfunction, Improper body mechanics, Pain, Increased fascial restricitons, Increased muscle spasms, Abnormal gait, Decreased strength, Decreased endurance, Decreased activity tolerance  Visit Diagnosis: Weakness generalized  Myalgia  Other abnormalities of gait and mobility  Other symptoms and signs involving  the musculoskeletal system     Problem List Patient Active Problem List   Diagnosis Date Noted  . Pelvic relaxation 10/09/2014    Natasja Niday Rober Minion PT, MPH  11/22/2015, 4:35 PM  [facility] [address] [place], [place], [address] Phone: [phone]   Fax:  [phone]  Name: Chemika Nightengale MRN: [id number] Date of Birth: 10/17/1970

## 2015-11-25 ENCOUNTER — Ambulatory Visit (INDEPENDENT_AMBULATORY_CARE_PROVIDER_SITE_OTHER): Payer: Worker's Compensation | Admitting: Physical Therapy

## 2015-11-25 DIAGNOSIS — R29898 Other symptoms and signs involving the musculoskeletal system: Secondary | ICD-10-CM

## 2015-11-25 DIAGNOSIS — R2689 Other abnormalities of gait and mobility: Secondary | ICD-10-CM

## 2015-11-25 DIAGNOSIS — M791 Myalgia, unspecified site: Secondary | ICD-10-CM

## 2015-11-25 DIAGNOSIS — R531 Weakness: Secondary | ICD-10-CM | POA: Diagnosis not present

## 2015-11-25 NOTE — Therapy (Signed)
[facility] [address] [place], [place], [address] Phone: [phone]   Fax:  [phone]  Physical Therapy Treatment  Patient Details  Name: Chelsea Fritz MRN: [id number] Date of Birth: 06/19/1970 Referring Provider: Dr. Venita Lickahari Brooks.   Encounter Date: 11/25/2015      PT End of Session - 11/25/15 1439    Visit Number 19   Number of Visits 24   Date for PT Re-Evaluation 12/09/15   PT Start Time 1437   PT Stop Time 1515   PT Time Calculation (min) 38 min   Activity Tolerance Patient limited by pain      Past Medical History:  Diagnosis Date  . Anemia   . Chronic bronchitis (HCC)   . Headache    otc med prn, sleep  . History of kidney stones    passed stone - no surgery required  . 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 Seasonal allergies   . SI (sacroiliac) pain    ablations done - SI joint - no anesthesia, darvocet only  . SVD (spontaneous vaginal delivery)    x 4    Past Surgical History:  Procedure Laterality Date  . ANTERIOR AND POSTERIOR REPAIR WITH SACROSPINOUS FIXATION N/A 10/09/2014   Procedure: ANTERIOR AND POSTERIOR REPAIR WITH SACROSPINOUS LIGAMENT SUSPENSION;  Surgeon: Harold HedgeJames Tomblin, MD;  Location: [facility];  Service: Gynecology;  Laterality: N/A;  . BILATERAL SALPINGECTOMY Bilateral 10/09/2014   Procedure: BILATERAL SALPINGECTOMY;  Surgeon: Harold HedgeJames Tomblin, MD;  Location: [facility];  Service: Gynecology;  Laterality: Bilateral;  . DILITATION & CURRETTAGE/HYSTROSCOPY WITH NOVASURE ABLATION    . KNEE SURGERY     x 2 - left  . LAPAROSCOPIC ASSISTED VAGINAL HYSTERECTOMY N/A 10/09/2014   Procedure: LAPAROSCOPIC ASSISTED VAGINAL HYSTERECTOMY;  Surgeon: Harold HedgeJames Tomblin, MD;  Location: [facility];  Service: Gynecology;  Laterality: N/A;  . NECK SURGERY     ? C4-C5  . ROTATOR CUFF REPAIR     left  . TUBAL LIGATION    . WISDOM TOOTH EXTRACTION      There were no vitals filed for this  visit.      Subjective Assessment - 11/25/15 1440    Subjective Pt reports she has a consistant ache across low back since she had to do her training. She can still only sit 27 minutes before her Lt hip "starts screaming".   Stair ascending with LLE continues to be challenging.   She has been in the pool Friday, multiple times Saturday, and none on Sunday.     Currently in Pain? Yes   Pain Score 4    Pain Location Hip   Pain Orientation Left   Pain Descriptors / Indicators Aching;Dull   Aggravating Factors  sitting, prolonged standing.    Pain Relieving Factors TENS, heat             [facility] PT Assessment - 11/25/15 0001      Assessment   Medical Diagnosis SI dysfunction    Referring Provider Dr. Venita Lickahari Brooks.    Onset Date/Surgical Date 07/15/10   Hand Dominance Left   Next MD Visit 11/29/15   Prior Therapy yes      Flexibility   Hamstrings LLE 95 deg, RLE 99 deg.    Quadriceps Lt 127 deg, Rt 134 deg.            [facility] Treatment/Exercise - 11/25/15 0001      Lumbar Exercises: Stretches  Passive Hamstring Stretch 3 reps;30 seconds   Quad Stretch 30 seconds;3 reps  Seated with foot under chair, then 3 reps in supine with strap      Lumbar Exercises: Aerobic   Stationary Bike Nustep L4 x 7 min      Ultrasound   Ultrasound Location Lt TFL / Lt glute med    Ultrasound Parameters 1.2 w/cm2, 100%, 8 min total    Ultrasound Goals Pain  tightness     Manual Therapy   Manual Therapy Myofascial release;Soft tissue mobilization   Manual therapy comments Pt in supine, then sidelying.    Soft tissue mobilization to Lt glute med/ piriformis.    Myofascial Release to Lt quad/ TFL     [employer]   Inhibit Muscle  I strip placed over Lt TFL, Lt glute med and Lt piriformis to decompress tissue, inhibit, and decrease pain.                      PT Long Term Goals - 11/22/15 1627      PT LONG TERM GOAL #1   Title Improve trunk and LE mobility to  functional level 12/09/15   Time 6   Period Weeks   Status On-going     PT LONG TERM GOAL #2   Title Normal gait pattern witoht assistive device and minimal pain over level and unlevel surfaces 12/09/15   Time 6   Period Weeks   Status On-going     PT LONG TERM GOAL #3   Title Improve functional activity level with patient to tolerate 30 min of sitting; 15 min of standing and 5-10 min of walking 12/09/15   Time 6   Period Weeks   Status On-going     PT LONG TERM GOAL #4   Title Independent in HEP 12/09/15   Time 6   Period Weeks   Status On-going     PT LONG TERM GOAL #5   Title Improve FOTO to </= 49% limitation 12/09/15   Time 6   Period Weeks   Status On-going               Plan - 11/25/15 1659    Clinical Impression Statement Pt had slight flare up with pain in Lt hip (mostly aware of pain in anterior hip, but point tender in lateral and posterior hip with manual therapy). Pt demonstrated improved hamstring and quad flexibility this visit; exercises were gentle in nature.  Trial of Rock tape applied to tender areas in hip to decrease pain and inhibit muscle spasm. Pt reported decreased Lt hip pain at end of session.    Rehab Potential Good   PT Frequency 2x / week   PT Duration 6 weeks   PT Treatment/Interventions Patient/family education;Cryotherapy;Electrical Stimulation;Iontophoresis 4mg /ml Dexamethasone;Moist Heat;Ultrasound;Neuromuscular re-education;Manual techniques;Dry needling;Therapeutic activities;Therapeutic exercise   PT Next Visit Plan Assess response to return to work and progress exercise and gait as tolerated.    PT Home Exercise Plan water exercise    Consulted and Agree with Plan of Care Patient      Patient will benefit from skilled therapeutic intervention in order to improve the following deficits and impairments:  Postural dysfunction, Improper body mechanics, Pain, Increased fascial restricitons, Increased muscle spasms, Abnormal gait,  Decreased strength, Decreased endurance, Decreased activity tolerance  Visit Diagnosis: Weakness generalized  Myalgia  Other abnormalities of gait and mobility  Other symptoms and signs involving the musculoskeletal system     Problem List Patient Active  Problem List   Diagnosis Date Noted  . Pelvic relaxation 10/09/2014   Mayer Camel, PTA 11/25/15 5:05 PM  [facility] [place] [address] [place], [place], [address] Phone: [phone]   Fax:  [phone]  Name: Chelsea Fritz MRN: [id number] Date of Birth: 1970-11-26

## 2015-11-27 ENCOUNTER — Encounter: Payer: Self-pay | Admitting: Rehabilitative and Restorative Service Providers"

## 2015-11-28 ENCOUNTER — Encounter: Payer: Self-pay | Admitting: Rehabilitative and Restorative Service Providers"

## 2015-11-28 ENCOUNTER — Ambulatory Visit (INDEPENDENT_AMBULATORY_CARE_PROVIDER_SITE_OTHER): Payer: Worker's Compensation | Admitting: Rehabilitative and Restorative Service Providers"

## 2015-11-28 DIAGNOSIS — R29898 Other symptoms and signs involving the musculoskeletal system: Secondary | ICD-10-CM | POA: Diagnosis not present

## 2015-11-28 DIAGNOSIS — M791 Myalgia, unspecified site: Secondary | ICD-10-CM

## 2015-11-28 DIAGNOSIS — R531 Weakness: Secondary | ICD-10-CM

## 2015-11-28 DIAGNOSIS — R2689 Other abnormalities of gait and mobility: Secondary | ICD-10-CM | POA: Diagnosis not present

## 2015-11-28 NOTE — Therapy (Signed)
[facility] [place], [place], [address] Phone: [phone]   Fax:  [phone]  Physical Therapy Treatment  Patient Details  Name: Chelsea Fritz MRN: [id number] Date of Birth: 1971/01/01 Referring Provider: Dr. Melina Schools  Encounter Date: 11/28/2015      PT End of Session - 11/28/15 1728    Visit Number 20   Number of Visits 24   Date for PT Re-Evaluation 12/09/15   PT Start Time 0938   PT Stop Time 1543   PT Time Calculation (min) 58 min   Activity Tolerance Patient tolerated treatment well      Past Medical History:  Diagnosis Date  . Anemia   . Chronic bronchitis ([place])   . Headache    otc med prn, sleep  . History of kidney stones    passed stone - no surgery required  . 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 Seasonal allergies   . SI (sacroiliac) pain    ablations done - SI joint - no anesthesia, darvocet only  . SVD (spontaneous vaginal delivery)    x 4    Past Surgical History:  Procedure Laterality Date  . ANTERIOR AND POSTERIOR REPAIR WITH SACROSPINOUS FIXATION N/A 10/09/2014   Procedure: ANTERIOR AND POSTERIOR REPAIR WITH SACROSPINOUS LIGAMENT SUSPENSION;  Surgeon: Everlene Farrier, MD;  Location: [facility];  Service: Gynecology;  Laterality: N/A;  . BILATERAL SALPINGECTOMY Bilateral 10/09/2014   Procedure: BILATERAL SALPINGECTOMY;  Surgeon: Everlene Farrier, MD;  Location: [facility];  Service: Gynecology;  Laterality: Bilateral;  . DILITATION & CURRETTAGE/HYSTROSCOPY WITH NOVASURE ABLATION    . KNEE SURGERY     x 2 - left  . LAPAROSCOPIC ASSISTED VAGINAL HYSTERECTOMY N/A 10/09/2014   Procedure: LAPAROSCOPIC ASSISTED VAGINAL HYSTERECTOMY;  Surgeon: Everlene Farrier, MD;  Location: [facility];  Service: Gynecology;  Laterality: N/A;  . NECK SURGERY     ? C4-C5  . ROTATOR CUFF REPAIR     left  . TUBAL LIGATION    . WISDOM TOOTH EXTRACTION      There were no vitals filed for  this visit.      Subjective Assessment - 11/28/15 1448    Subjective Patient reports that she is having pulling through the anterior Lt thigh and tightness in the posterior thigh/buttock area. She feels pulling all around the side of the Lt LE. Sitting tolerance is ~27 min. Ascending and descending stairs remains difficult due to weakness with ascending and combination of pain and weakness with descending. Stairs are getting better. Work is exhausting. She continually changing positions at work. She can't sit; stand;walk too long. She is unable to preform activities at home. She is too tired. Therapy helps relieve the strain in the muscles. Pain now seems to be the front of the back and the Lt buttock and hip into the anterior thigh.    Pertinent History HNP L5/S1; injury to cervical spine with disc replacement ? level surgery 2/13; injury to Lt knee resolved without intervention; torn tendons in Lt ankle; ablasions in the Rt SI    How long can you sit comfortably? 27   How long can you stand comfortably? 20   How long can you walk comfortably? 15   Diagnostic tests xrays; MRI    Patient Stated Goals get rid of pain and feel stronger in back and LE   Currently in Pain? Yes   Pain Score 4    Pain Location Back  Pain Orientation Left   Pain Descriptors / Indicators Aching;Dull   Pain Radiating Towards back radiating into the lateral hip and thigh; down the side of the leg and into the groin    Pain Onset Yesterday   Pain Frequency Intermittent   Aggravating Factors  sitting; prolonged standing; walking    Pain Relieving Factors PT; TENS; heat             [facility] PT Assessment - 11/28/15 0001      Assessment   Medical Diagnosis SI dysfunction    Referring Provider Dr. Melina Schools   Onset Date/Surgical Date 07/15/10   Hand Dominance Left   Next MD Visit 11/29/15   Prior Therapy yes      Flexibility   Hamstrings LLE 95 deg, RLE 99 deg.    Quadriceps Lt 127 deg, Rt 134 deg.       Palpation   Palpation comment tender through sacrum; Lt > Rt SI area; Lt posterior hip musculature into the Lt hamstrings and IT band; Lt groin                      [facility] Adult PT Treatment/Exercise - 11/28/15 0001      Lumbar Exercises: Stretches   Passive Hamstring Stretch 3 reps;30 seconds   Quad Stretch 30 seconds;3 reps  Seated with foot under chair.      Lumbar Exercises: Aerobic   Stationary Bike Nustep L4 x 7 min      Lumbar Exercises: Supine   Ab Set --  3 part core 10 sec x 10      Knee/Hip Exercises: Stretches   Ambulance person reps;20 seconds  PT assist    Hip Flexor Stretch Left;2 reps;60 seconds  PT assist    Other Knee/Hip Stretches hip abductor stretch 20 sec x 2 - pt supine      Moist Heat Therapy   Number Minutes Moist Heat 20 Minutes   Moist Heat Location Lumbar Spine;Hip  Lt - (+) anterior hip      Electrical Stimulation   Electrical Stimulation Location Lumbar paraspinals, posterior Lt/Rt hip    Electrical Stimulation Action IFC   Electrical Stimulation Parameters to tolerance   Electrical Stimulation Goals Tone;Pain     Manual Therapy   Manual Therapy Myofascial release;Soft tissue mobilization   Manual therapy comments Pt in supine   Soft tissue mobilization Lt iliopsoas; hip flexors/adductors; TFL/ITB    Myofascial Release Lt anterior thigh                      PT Long Term Goals - 11/28/15 1735      PT LONG TERM GOAL #1   Title Improve trunk and LE mobility to functional level 12/09/15   Time 6   Period Weeks   Status Achieved     PT LONG TERM GOAL #2   Title Normal gait pattern witoht assistive device and minimal pain over level and unlevel surfaces 12/09/15   Time 6   Period Weeks   Status Partially Met     PT LONG TERM GOAL #3   Title Improve functional activity level with patient to tolerate 30 min of sitting; 15 min of standing and 5-10 min of walking 12/09/15   Time 6   Status Partially Met     PT  LONG TERM GOAL #4   Title Independent in HEP 12/09/15   Time 6   Period Weeks   Status Partially  Met     PT LONG TERM GOAL #5   Title Improve FOTO to </= 49% limitation 12/09/15   Time 6   Period Weeks   Status On-going               Plan - 11/28/15 1730    Clinical Impression Statement Chelsea Fritz reports and demonstrates good progress with PT and HEP. She has increased ROM; improved functional strength; increased sitting/standing/walking tolerance; decreased pain - symptoms now intermittent and no longer constant. She has increased endurance and has returned to work. The return to work has increased Lt LE pain but patient is reporting improved endurance foe work. She continues to be exhausted at the end of her work day and has no energy for home and family life after work. Chelsea Fritz continues to have limited trunk and LE mobilty; pain and muscular tightness to palpation through the lumbar spine/Lt hip into the Lt hip flexors and quads. Chelsea Fritz will benefit form continued PT to improve strength; mobliity and function.    Rehab Potential Good   PT Frequency 2x / week   PT Duration 6 weeks   PT Treatment/Interventions Patient/family education;Cryotherapy;Electrical Stimulation;Iontophoresis 65m/ml Dexamethasone;Moist Heat;Ultrasound;Neuromuscular re-education;Manual techniques;Dry needling;Therapeutic activities;Therapeutic exercise   PT Next Visit Plan Note to MD - continue MD as visits are approved   PT Home Exercise Plan water exercise; HEP: TENS unit    Consulted and Agree with Plan of Care Patient      Patient will benefit from skilled therapeutic intervention in order to improve the following deficits and impairments:  Postural dysfunction, Improper body mechanics, Pain, Increased fascial restricitons, Increased muscle spasms, Abnormal gait, Decreased strength, Decreased endurance, Decreased activity tolerance  Visit Diagnosis: Weakness generalized  Myalgia  Other abnormalities of gait  and mobility  Other symptoms and signs involving the musculoskeletal system     Problem List Patient Active Problem List   Diagnosis Date Noted  . Pelvic relaxation 10/09/2014    Celyn PNilda SimmerPT, MPH  11/28/2015, 5:36 PM  [facility] [address]Phone: [phone]  Fax:  [phone] Name: TCharron CoultasMRN: [id number] of Birth: [birth year]

## 2015-12-09 ENCOUNTER — Ambulatory Visit (INDEPENDENT_AMBULATORY_CARE_PROVIDER_SITE_OTHER): Payer: Worker's Compensation | Admitting: Rehabilitative and Restorative Service Providers"

## 2015-12-09 ENCOUNTER — Encounter: Payer: Self-pay | Admitting: Rehabilitative and Restorative Service Providers"

## 2015-12-09 DIAGNOSIS — R29898 Other symptoms and signs involving the musculoskeletal system: Secondary | ICD-10-CM

## 2015-12-09 DIAGNOSIS — R531 Weakness: Secondary | ICD-10-CM

## 2015-12-09 DIAGNOSIS — R2689 Other abnormalities of gait and mobility: Secondary | ICD-10-CM

## 2015-12-09 DIAGNOSIS — M791 Myalgia, unspecified site: Secondary | ICD-10-CM

## 2015-12-09 NOTE — Therapy (Signed)
[facility] [place] [address] [place], [place], [address] Phone: [phone]   Fax:  [phone]  Physical Therapy Treatment  Patient Details  Name: Chelsea Fritz MRN: [id number] Date of Birth: 1970-12-30 Referring Provider: Dr. Venita Lick  Encounter Date: 12/09/2015      PT End of Session - 12/09/15 1518    Visit Number 21   Number of Visits 24   Date for PT Re-Evaluation 12/09/15   PT Start Time 1515   PT Stop Time 1614   PT Time Calculation (min) 59 min   Activity Tolerance Patient tolerated treatment well      Past Medical History:  Diagnosis Date  . Anemia   . Chronic bronchitis ([facility])   . Headache    otc med prn, sleep  . History of kidney stones    passed stone - no surgery required  . 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 Seasonal allergies   . SI (sacroiliac) pain    ablations done - SI joint - no anesthesia, darvocet only  . SVD (spontaneous vaginal delivery)    x 4    Past Surgical History:  Procedure Laterality Date  . ANTERIOR AND POSTERIOR REPAIR WITH SACROSPINOUS FIXATION N/A 10/09/2014   Procedure: ANTERIOR AND POSTERIOR REPAIR WITH SACROSPINOUS LIGAMENT SUSPENSION;  Surgeon: Harold Hedge, MD;  Location: [facility];  Service: Gynecology;  Laterality: N/A;  . BILATERAL SALPINGECTOMY Bilateral 10/09/2014   Procedure: BILATERAL SALPINGECTOMY;  Surgeon: Harold Hedge, MD;  Location: [facility];  Service: Gynecology;  Laterality: Bilateral;  . DILITATION & CURRETTAGE/HYSTROSCOPY WITH NOVASURE ABLATION    . KNEE SURGERY     x 2 - left  . LAPAROSCOPIC ASSISTED VAGINAL HYSTERECTOMY N/A 10/09/2014   Procedure: LAPAROSCOPIC ASSISTED VAGINAL HYSTERECTOMY;  Surgeon: Harold Hedge, MD;  Location: [facility];  Service: Gynecology;  Laterality: N/A;  . NECK SURGERY     ? C4-C5  . ROTATOR CUFF REPAIR     left  . TUBAL LIGATION    . WISDOM TOOTH EXTRACTION      There were no vitals filed for  this visit.      Subjective Assessment - 12/09/15 1518    Subjective MD was pleased with progress. Wants patient to continue PT 2x/wk for 8 weeks. She can tell she regressed without PT last week. She has increased pain and soreness since last week even with continuing with her HEP and TENS unit.    Currently in Pain? Yes   Pain Score 5    Pain Location Back   Pain Orientation Left   Pain Descriptors / Indicators Aching;Dull;Heaviness   Pain Type Chronic pain   Pain Onset More than a month ago   Pain Frequency Intermittent                         [facility] Adult PT Treatment/Exercise - 12/09/15 0001      Lumbar Exercises: Stretches   Passive Hamstring Stretch 3 reps;30 seconds   Quad Stretch 30 seconds;3 reps  Seated with foot under chair.      Lumbar Exercises: Aerobic   Stationary Bike Nustep L4 x 6 min      Lumbar Exercises: Standing   Other Standing Lumbar Exercises weight shifts side to side, front to back, back to front; marching in place on blue foam pad   Other Standing Lumbar Exercises step ups 4 inch step 10 reps each side 2 sets  Lumbar Exercises: Supine   Ab Set --  3 part core 10 sec x 10      Lumbar Exercises: Prone   Other Prone Lumbar Exercises 5 reps pelvic press, then 10 reps with knee flex/ext bilat.      Knee/Hip Exercises: Stretches   Aeronautical engineerQuad Stretch Left;2 reps;20 seconds  PT assist    Hip Flexor Stretch Left;2 reps;60 seconds  PT assist      Moist Heat Therapy   Number Minutes Moist Heat 20 Minutes   Moist Heat Location Lumbar Spine;Hip  Lt - (+) anterior hip      Electrical Stimulation   Electrical Stimulation Location Lumbar paraspinals, posterior Lt/Rt hip    Electrical Stimulation Action IFC   Electrical Stimulation Parameters to tolerance   Electrical Stimulation Goals Tone;Pain     Manual Therapy   Manual Therapy Myofascial release;Soft tissue mobilization   Manual therapy comments Pt in prone and supine   Soft tissue  mobilization Lt iliopsoas; hip flexors/adductors; TFL/ITB    Myofascial Release Lt anterior thigh                 PT Education - 12/09/15 1602    Education provided Yes   Education Details HEP working on Engineer, manufacturing systemsstrengthening activities using body resistance    Person(s) Educated Patient   Methods Explanation;Verbal cues   Comprehension Verbalized understanding             PT Long Term Goals - 12/09/15 1606      PT LONG TERM GOAL #1   Title Improve trunk and LE stability allowing patient to increase functional level for standing and walking 02/03/16   Time 8   Period Weeks   Status New     PT LONG TERM GOAL #2   Title Normal gait pattern with minimal to no pain over unlevel surfaces 02/03/16   Time 8   Period Weeks   Status New     PT LONG TERM GOAL #3   Title Improve functional activity level with patient to tolerate 60 min of sitting; 15 min of standing and 10-15 min of walking 02/03/16   Time 8   Period Weeks   Status New     PT LONG TERM GOAL #4   Title Independent in advanced HEP 02/03/16   Time 8   Period Weeks   Status New     PT LONG TERM GOAL #5   Title Improve FOTO to </= 49% limitation 02/03/16   Time 8   Period Weeks   Status New               Plan - 12/09/15 1602    Clinical Impression Statement Chelsea Fritz has increased pain and increased tightness through the Lt lumbar and hip area without PT last week. She reports increased tightness, soreness and pain in the past week. Patient has continued with HEP and use of TENS unit but can tell a difference without the manual work in PT. She hs increased muscular tightness to palpation through the Lt lumbar paraspinals and hip musculature as well as the anterior Lt hip - flexors and adductors. Responded well to manual work followed by modalities today.    Rehab Potential Good   PT Frequency 2x / week   PT Duration 8 weeks   PT Treatment/Interventions Patient/family education;Cryotherapy;Electrical  Stimulation;Iontophoresis 4mg /ml Dexamethasone;Moist Heat;Ultrasound;Neuromuscular re-education;Manual techniques;Dry needling;Therapeutic activities;Therapeutic exercise   PT Next Visit Plan manual work Lt LB and hip progress with strengthening with body weight resistance -  moniter response to treatment.    PT Home Exercise Plan HEP: TENS unit    Consulted and Agree with Plan of Care Patient      Patient will benefit from skilled therapeutic intervention in order to improve the following deficits and impairments:  Postural dysfunction, Improper body mechanics, Pain, Increased fascial restricitons, Increased muscle spasms, Abnormal gait, Decreased strength, Decreased endurance, Decreased activity tolerance  Visit Diagnosis: Weakness generalized - Plan: PT plan of care cert/re-cert  Myalgia - Plan: PT plan of care cert/re-cert  Other abnormalities of gait and mobility - Plan: PT plan of care cert/re-cert  Other symptoms and signs involving the musculoskeletal system - Plan: PT plan of care cert/re-cert     Problem List Patient Active Problem List   Diagnosis Date Noted  . Pelvic relaxation 10/09/2014    Chelsea Fritz PT, MPH  12/09/2015, 5:23 PM  [facility] [address] [place] [place] [address] [place], [place], [address] Phone: [phone]   Fax:  [phone]  Name: Chelsea Fritz MRN: [id number] Date of Birth: 11-24-1970

## 2015-12-11 ENCOUNTER — Ambulatory Visit (INDEPENDENT_AMBULATORY_CARE_PROVIDER_SITE_OTHER): Payer: Worker's Compensation | Admitting: Rehabilitative and Restorative Service Providers"

## 2015-12-11 ENCOUNTER — Encounter: Payer: Self-pay | Admitting: Rehabilitative and Restorative Service Providers"

## 2015-12-11 DIAGNOSIS — M791 Myalgia, unspecified site: Secondary | ICD-10-CM

## 2015-12-11 DIAGNOSIS — R2689 Other abnormalities of gait and mobility: Secondary | ICD-10-CM | POA: Diagnosis not present

## 2015-12-11 DIAGNOSIS — R531 Weakness: Secondary | ICD-10-CM | POA: Diagnosis not present

## 2015-12-11 DIAGNOSIS — R29898 Other symptoms and signs involving the musculoskeletal system: Secondary | ICD-10-CM

## 2015-12-11 NOTE — Therapy (Signed)
[facility] [address] [place] [address] [place], [place], [address] Phone: [phone]   Fax:  [phone]  Physical Therapy Treatment  Patient Details  Name: Chelsea Fritz MRN: [id number] Date of Birth: 1970/07/18 Referring Provider: Dr. Venita Lick  Encounter Date: 12/11/2015      PT End of Session - 12/11/15 1604    Visit Number 22   Number of Visits 24   Date for PT Re-Evaluation 12/09/15   PT Start Time 1515   PT Stop Time 1609   PT Time Calculation (min) 54 min   Activity Tolerance Patient tolerated treatment well      Past Medical History:  Diagnosis Date  . Anemia   . Chronic bronchitis ([facility])   . Headache    otc med prn, sleep  . History of kidney stones    passed stone - no surgery required  . 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 Seasonal allergies   . SI (sacroiliac) pain    ablations done - SI joint - no anesthesia, darvocet only  . SVD (spontaneous vaginal delivery)    x 4    Past Surgical History:  Procedure Laterality Date  . ANTERIOR AND POSTERIOR REPAIR WITH SACROSPINOUS FIXATION N/A 10/09/2014   Procedure: ANTERIOR AND POSTERIOR REPAIR WITH SACROSPINOUS LIGAMENT SUSPENSION;  Surgeon: Harold Hedge, MD;  Location: [facility];  Service: Gynecology;  Laterality: N/A;  . BILATERAL SALPINGECTOMY Bilateral 10/09/2014   Procedure: BILATERAL SALPINGECTOMY;  Surgeon: Harold Hedge, MD;  Location: [facility];  Service: Gynecology;  Laterality: Bilateral;  . DILITATION & CURRETTAGE/HYSTROSCOPY WITH NOVASURE ABLATION    . KNEE SURGERY     x 2 - left  . LAPAROSCOPIC ASSISTED VAGINAL HYSTERECTOMY N/A 10/09/2014   Procedure: LAPAROSCOPIC ASSISTED VAGINAL HYSTERECTOMY;  Surgeon: Harold Hedge, MD;  Location: [facility];  Service: Gynecology;  Laterality: N/A;  . NECK SURGERY     ? C4-C5  . ROTATOR CUFF REPAIR     left  . TUBAL LIGATION    . WISDOM TOOTH EXTRACTION      There were no vitals filed for  this visit.      Subjective Assessment - 12/11/15 1559    Subjective Significant flare up today with pain in the Lt hip/buttock/posterior thigh to knee. Upon questioning patient remembered that she walked much more than usual and went up and down at least 3 flights of stirs carrying her bookbag b/c the elevator was broken. Was down to 3/10 after last treatment. Has not been that good since she started back to work. Was 0/10 prior to RTW   Currently in Pain? Yes   Pain Score 5    Pain Orientation Left   Pain Descriptors / Indicators Aching;Burning;Shooting   Pain Type Chronic pain                         [facility] Adult PT Treatment/Exercise - 12/11/15 0001      Lumbar Exercises: Standing   Other Standing Lumbar Exercises weight shifts side to side, front to back, back to front; marching in place on blue foam pad   Other Standing Lumbar Exercises hip abd; hp ext; hip flexion x 10 - some discomfort Lt      Lumbar Exercises: Supine   Ab Set --  3 part core 10 sec x 10    Heel Slides 10 reps     Knee/Hip Exercises: Stretches   Aeronautical engineer  reps;20 seconds  PT assist    Hip Flexor Stretch Left;2 reps;60 seconds  PT assist      Moist Heat Therapy   Number Minutes Moist Heat 20 Minutes   Moist Heat Location Lumbar Spine;Hip  Lt - (+) anterior hip      Electrical Stimulation   Electrical Stimulation Location Lumbar paraspinals, posterior Lt/Rt hip    Electrical Stimulation Action IFC   Electrical Stimulation Parameters to tolerance   Electrical Stimulation Goals Tone;Pain     Ultrasound   Ultrasound Location Lt lumbar to sacral mausculature into Lt gluts/piriformis    Ultrasound Parameters 1.5 w/cm2; 1 mHz; 100%; 8 min    Ultrasound Goals Pain  muscular tightness      Manual Therapy   Manual Therapy Myofascial release;Soft tissue mobilization   Manual therapy comments Pt in prone and supine   Soft tissue mobilization Lt iliopsoas; hip flexors/adductors;  TFL/ITB    Myofascial Release Lt anterior thigh                      PT Long Term Goals - 12/09/15 1606      PT LONG TERM GOAL #1   Title Improve trunk and LE stability allowing patient to increase functional level for standing and walking 02/03/16   Time 8   Period Weeks   Status New     PT LONG TERM GOAL #2   Title Normal gait pattern with minimal to no pain over unlevel surfaces 02/03/16   Time 8   Period Weeks   Status New     PT LONG TERM GOAL #3   Title Improve functional activity level with patient to tolerate 60 min of sitting; 15 min of standing and 10-15 min of walking 02/03/16   Time 8   Period Weeks   Status New     PT LONG TERM GOAL #4   Title Independent in advanced HEP 02/03/16   Time 8   Period Weeks   Status New     PT LONG TERM GOAL #5   Title Improve FOTO to </= 49% limitation 02/03/16   Time 8   Period Weeks   Status New               Plan - 12/11/15 1605    Clinical Impression Statement Flare up of symptoms related to work activities. 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 Fatigue plays a part in the increase in symptoms as well sa week out of therapy awaiting WC approval and scheduling. Patient will begin water exercises on land decreasing reps. She is continuing with stretching and stabilization.     Rehab Potential Good   PT Frequency 2x / week   PT Duration 8 weeks   PT Treatment/Interventions Patient/family education;Cryotherapy;Electrical Stimulation;Iontophoresis 4mg /ml Dexamethasone;Moist Heat;Ultrasound;Neuromuscular re-education;Manual techniques;Dry needling;Therapeutic activities;Therapeutic exercise   PT Next Visit Plan manual work Lt LB and hip progress with strengthening with body weight resistance - moniter response to treatment.    Consulted and Agree with Plan of Care Patient      Patient will benefit from skilled therapeutic intervention in order to improve the  following deficits and impairments:  Postural dysfunction, Improper body mechanics, Pain, Increased fascial restricitons, Increased muscle spasms, Abnormal gait, Decreased strength, Decreased endurance, Decreased activity tolerance  Visit Diagnosis: Weakness generalized  Myalgia  Other abnormalities of gait and mobility  Other symptoms and signs involving the musculoskeletal system  Problem List Patient Active Problem List   Diagnosis Date Noted  . Pelvic relaxation 10/09/2014    Corrado Hymon Rober MinionP Dolphus Linch PT, MPH  12/11/2015, 4:08 PM  [facility] [address] [place] [address] [place], [place], [address] Phone: [phone]   Fax:  [phone]  Name: Chelsea Fritz MRN: [id number] Date of Birth: 24-Feb-1971

## 2015-12-16 ENCOUNTER — Ambulatory Visit (INDEPENDENT_AMBULATORY_CARE_PROVIDER_SITE_OTHER): Payer: Worker's Compensation | Admitting: Rehabilitative and Restorative Service Providers"

## 2015-12-16 ENCOUNTER — Encounter: Payer: Self-pay | Admitting: Rehabilitative and Restorative Service Providers"

## 2015-12-16 DIAGNOSIS — R2689 Other abnormalities of gait and mobility: Secondary | ICD-10-CM

## 2015-12-16 DIAGNOSIS — M791 Myalgia, unspecified site: Secondary | ICD-10-CM

## 2015-12-16 DIAGNOSIS — R531 Weakness: Secondary | ICD-10-CM

## 2015-12-16 DIAGNOSIS — R29898 Other symptoms and signs involving the musculoskeletal system: Secondary | ICD-10-CM | POA: Diagnosis not present

## 2015-12-16 NOTE — Therapy (Signed)
[facility] [place] [address] [place], [place], [address] Phone: [phone]   Fax:  [phone]  Physical Therapy Treatment  Patient Details  Name: Chelsea Fritz MRN: [id number] Date of Birth: 1970-10-25 Referring Provider: Dr Venita Lick  Encounter Date: 12/16/2015      PT End of Session - 12/16/15 1706    Visit Number 23   Number of Visits 36   Date for PT Re-Evaluation 02/03/16   PT Start Time 1600   PT Stop Time 1656   PT Time Calculation (min) 56 min   Activity Tolerance Patient tolerated treatment well      Past Medical History:  Diagnosis Date  . Anemia   . Chronic bronchitis (HCC)   . Headache    otc med prn, sleep  . History of kidney stones    passed stone - no surgery required  . 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 Seasonal allergies   . SI (sacroiliac) pain    ablations done - SI joint - no anesthesia, darvocet only  . SVD (spontaneous vaginal delivery)    x 4    Past Surgical History:  Procedure Laterality Date  . ANTERIOR AND POSTERIOR REPAIR WITH SACROSPINOUS FIXATION N/A 10/09/2014   Procedure: ANTERIOR AND POSTERIOR REPAIR WITH SACROSPINOUS LIGAMENT SUSPENSION;  Surgeon: Harold Hedge, MD;  Location: [facility];  Service: Gynecology;  Laterality: N/A;  . BILATERAL SALPINGECTOMY Bilateral 10/09/2014   Procedure: BILATERAL SALPINGECTOMY;  Surgeon: Harold Hedge, MD;  Location: [facility];  Service: Gynecology;  Laterality: Bilateral;  . DILITATION & CURRETTAGE/HYSTROSCOPY WITH NOVASURE ABLATION    . KNEE SURGERY     x 2 - left  . LAPAROSCOPIC ASSISTED VAGINAL HYSTERECTOMY N/A 10/09/2014   Procedure: LAPAROSCOPIC ASSISTED VAGINAL HYSTERECTOMY;  Surgeon: Harold Hedge, MD;  Location: [facility];  Service: Gynecology;  Laterality: N/A;  . NECK SURGERY     ? C4-C5  . ROTATOR CUFF REPAIR     left  . TUBAL LIGATION    . WISDOM TOOTH EXTRACTION      There were no vitals filed for  this visit.      Subjective Assessment - 12/16/15 1613    Subjective Weekend was ok with less pain until last night when she had increased pain which she feels was related to activities - just too much, unable to space out the activities like she usually does. Tried to get in her pool yesterday but it was too cold. Exercise tolerance is greater in the water. Can tell a difference in how back/hip/LE feel as she fatigues at the end of the day. Continues to work on her exercises at home on a daily basis. Will investigate indoor community pools for water exercises. Discomfort and fatigue with exercises in the clinic today.    Currently in Pain? Yes   Pain Score 4    Pain Type Chronic pain   Pain Onset More than a month ago   Pain Frequency Intermittent            [facility] PT Assessment - 12/16/15 0001      Assessment   Medical Diagnosis SI dysfunction    Referring Provider Dr Venita Lick   Onset Date/Surgical Date 07/15/10   Hand Dominance Left   Next MD Visit 11/17     Observation/Other Assessments   Focus on Therapeutic Outcomes (FOTO)  53% limitation      Palpation   Palpation comment tendernes and tightness through  sacrum; Lt > Rt SI area; Lt posterior hip musculature into the Lt hamstrings and IT band; Lt iliopsoas and hip flexors, adductors, groin                      [facility] Adult PT Treatment/Exercise - 12/16/15 0001      Lumbar Exercises: Stretches   Quad Stretch 30 seconds;3 reps  supine with PT assist quad; hip flexor; sartorius stretches      Lumbar Exercises: Aerobic   Stationary Bike Nustep L4 x 6 min      Lumbar Exercises: Standing   Heel Raises 10 reps   Functional Squats 10 reps   Other Standing Lumbar Exercises weight shifts side to side, front to back, back to front; marching in place on blue foam pad; alt toe touch to 12 inch step    Other Standing Lumbar Exercises hip abd; hip ext; hip flexion against TB resistance - red  x 10 each LE - some  discomfort Lt lateral and anterior thigh     Knee/Hip Exercises: Stretches   Aeronautical engineer reps;20 seconds  PT assist    Hip Flexor Stretch Left;2 reps;60 seconds  PT assist      Knee/Hip Exercises: Standing   Other Standing Knee Exercises side steps with green TB above knees 5 feet each direction x 10      Moist Heat Therapy   Number Minutes Moist Heat 15 Minutes   Moist Heat Location Lumbar Spine;Hip  Lt - (+) anterior hip      Electrical Stimulation   Electrical Stimulation Location Rt hip laterally and anteriorly; anterior Rt thigh   Electrical Stimulation Action IFC   Electrical Stimulation Parameters to tolerance   Electrical Stimulation Goals Tone;Pain     Manual Therapy   Manual Therapy Myofascial release;Soft tissue mobilization   Manual therapy comments pt supine    Soft tissue mobilization Lt iliopsoas; hip flexors/adductors; TFL/ITB    Myofascial Release Lt anterior thigh                 PT Education - 12/16/15 1706    Education provided Yes   Education Details hip abd and ext with red theraband; lateral stepping with green theraband - issued TB for home    Person(s) Educated Patient   Methods Explanation;Demonstration;Tactile cues;Verbal cues;Handout   Comprehension Verbalized understanding;Returned demonstration;Verbal cues required;Tactile cues required             PT Long Term Goals - 12/16/15 1712      PT LONG TERM GOAL #1   Title Improve trunk and LE stability allowing patient to increase functional level for standing and walking 02/03/16   Time 8   Period Weeks   Status Revised     PT LONG TERM GOAL #2   Title Normal gait pattern with minimal to no pain over unlevel surfaces 02/03/16   Time 8   Period Weeks   Status On-going     PT LONG TERM GOAL #3   Title Improve functional activity level with patient to tolerate 60 min of sitting; 15 min of standing and 10-15 min of walking 02/03/16   Time 8   Period Weeks   Status  On-going     PT LONG TERM GOAL #4   Title Independent in advanced HEP 02/03/16   Time 8   Period Weeks   Status On-going     PT LONG TERM GOAL #5   Title Improve FOTO to </= 49%  limitation 02/03/16   Time 8   Period Weeks   Status On-going               Plan - 12/16/15 1708    Clinical Impression Statement Continued gradual progress with strength and stability. Patient has significantly increased functional activity level with fatigue from ADL's and return to work creating increase in muscular tightness and pain as well as fitigue - which in turn slows strengthening efforts. Her pool has become too cold to allow her to exercise in the water. Delice Bisonara will check for indoor community pools to continue water exercise. She has made excellent progress. She is progressing gradually toward goals of therapy and requires continued PT to reach maximum level of rehab potential.    Rehab Potential Good   PT Frequency 2x / week   PT Duration 8 weeks   PT Treatment/Interventions Patient/family education;Cryotherapy;Electrical Stimulation;Iontophoresis 4mg /ml Dexamethasone;Moist Heat;Ultrasound;Neuromuscular re-education;Manual techniques;Dry needling;Therapeutic activities;Therapeutic exercise   PT Next Visit Plan manual work Lt LB and hip progress with strengthening with body weight resistance - moniter response to treatment.    PT Home Exercise Plan HEP: TENS unit    Consulted and Agree with Plan of Care Patient      Patient will benefit from skilled therapeutic intervention in order to improve the following deficits and impairments:  Postural dysfunction, Improper body mechanics, Pain, Increased fascial restricitons, Increased muscle spasms, Abnormal gait, Decreased strength, Decreased endurance, Decreased activity tolerance  Visit Diagnosis: Weakness generalized  Myalgia  Other abnormalities of gait and mobility  Other symptoms and signs involving the musculoskeletal  system     Problem List Patient Active Problem List   Diagnosis Date Noted  . Pelvic relaxation 10/09/2014    Celyn Rober MinionP Holt PT, MPH  12/16/2015, 5:15 PM  [facility] [address] [place], [place], [address] Phone: [phone]   Fax:  [phone]  Name: Uvaldo Risingara Willcox MRN: [id number] Date of Birth: [birth year]

## 2015-12-20 ENCOUNTER — Ambulatory Visit (INDEPENDENT_AMBULATORY_CARE_PROVIDER_SITE_OTHER): Payer: Worker's Compensation | Admitting: Rehabilitative and Restorative Service Providers"

## 2015-12-20 ENCOUNTER — Encounter: Payer: Self-pay | Admitting: Rehabilitative and Restorative Service Providers"

## 2015-12-20 DIAGNOSIS — M791 Myalgia, unspecified site: Secondary | ICD-10-CM

## 2015-12-20 DIAGNOSIS — R29898 Other symptoms and signs involving the musculoskeletal system: Secondary | ICD-10-CM

## 2015-12-20 DIAGNOSIS — R2689 Other abnormalities of gait and mobility: Secondary | ICD-10-CM

## 2015-12-20 DIAGNOSIS — R531 Weakness: Secondary | ICD-10-CM

## 2015-12-20 NOTE — Therapy (Signed)
[facility] [address] [place], [place], [address] Phone: [phone]   Fax:  [phone]  Physical Therapy Treatment  Patient Details  Name: Chelsea Fritz MRN: [id number] Date of Birth: [birth year] Referring Provider: Dr Venita Lick  Encounter Date: 12/20/2015      PT End of Session - 12/20/15 1631    PT Start Time 1530   PT Stop Time 1626   PT Time Calculation (min) 56 min   Activity Tolerance Patient tolerated treatment well      Past Medical History:  Diagnosis Date  . Anemia   . Chronic bronchitis (HCC)   . Headache    otc med prn, sleep  . History of kidney stones    passed stone - no surgery required  . 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 Seasonal allergies   . SI (sacroiliac) pain    ablations done - SI joint - no anesthesia, darvocet only  . SVD (spontaneous vaginal delivery)    x 4    Past Surgical History:  Procedure Laterality Date  . ANTERIOR AND POSTERIOR REPAIR WITH SACROSPINOUS FIXATION N/A 10/09/2014   Procedure: ANTERIOR AND POSTERIOR REPAIR WITH SACROSPINOUS LIGAMENT SUSPENSION;  Surgeon: Harold Hedge, MD;  Location: [facility];  Service: Gynecology;  Laterality: N/A;  . BILATERAL SALPINGECTOMY Bilateral 10/09/2014   Procedure: BILATERAL SALPINGECTOMY;  Surgeon: Harold Hedge, MD;  Location: [facility];  Service: Gynecology;  Laterality: Bilateral;  . DILITATION & CURRETTAGE/HYSTROSCOPY WITH NOVASURE ABLATION    . KNEE SURGERY     x 2 - left  . LAPAROSCOPIC ASSISTED VAGINAL HYSTERECTOMY N/A 10/09/2014   Procedure: LAPAROSCOPIC ASSISTED VAGINAL HYSTERECTOMY;  Surgeon: Harold Hedge, MD;  Location: [facility];  Service: Gynecology;  Laterality: N/A;  . NECK SURGERY     ? C4-C5  . ROTATOR CUFF REPAIR     left  . TUBAL LIGATION    . WISDOM TOOTH EXTRACTION      There were no vitals filed for this visit.      Subjective Assessment - 12/20/15 1640    Subjective  Increased pain and tightness through Lt posterior hip with pain into the Lt foot. 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 also makes a difference that she has not been to PT since Monday and that she has not been able to exercise in the water due to the change in weather. Using TENS unit which helps.   Currently in Pain? Yes   Pain Score 4    Pain Location Back   Pain Orientation Left   Pain Descriptors / Indicators Aching;Burning;Throbbing;Tightness   Pain Type Chronic pain   Pain Onset More than a month ago   Pain Frequency Intermittent                         [facility] Adult PT Treatment/Exercise - 12/20/15 0001      Lumbar Exercises: Stretches   Passive Hamstring Stretch 3 reps;30 seconds   Quad Stretch 30 seconds;3 reps  supine with PT assist quad; hip flexor; sartorius stretches    ITB Stretch 30 seconds  gentle      Lumbar Exercises: Aerobic   Stationary Bike Nustep L4 x 6 min      Lumbar Exercises: Standing   Functional Squats 10 reps   Other Standing Lumbar Exercises weight shifts side to side, front to back, back to front; marching in  place on blue foam pad; alt toe touch to 12 inch step    Other Standing Lumbar Exercises hip abd; hip ext; hip flexion against TB resistance - red  x 10 each LE - some discomfort Lt lateral and anterior thigh     Lumbar Exercises: Supine   Heel Slides 10 reps     Moist Heat Therapy   Number Minutes Moist Heat 15 Minutes   Moist Heat Location Lumbar Spine;Hip  Lt - (+) anterior hip      Electrical Stimulation   Electrical Stimulation Location Lt posterior and lateral hip    Electrical Stimulation Action IFC   Electrical Stimulation Parameters to tolerance   Electrical Stimulation Goals Tone;Pain     Ultrasound   Ultrasound Location Lt posterior hip    Ultrasound Parameters 1.5 w/cm2; 1 mHz; 100%; 8 min    Ultrasound Goals Pain;Other (Comment)  muscular tightness      Manual Therapy   Manual  Therapy Myofascial release;Soft tissue mobilization   Manual therapy comments Pt prone    Soft tissue mobilization Lt posterior hip - gluts/piriformis with trigger point work to release tightness through musculature working into the Rt lateral hip through TFL    Myofascial Release Lt posterior hip                      PT Long Term Goals - 12/16/15 1712      PT LONG TERM GOAL #1   Title Improve trunk and LE stability allowing patient to increase functional level for standing and walking 02/03/16   Time 8   Period Weeks   Status Revised     PT LONG TERM GOAL #2   Title Normal gait pattern with minimal to no pain over unlevel surfaces 02/03/16   Time 8   Period Weeks   Status On-going     PT LONG TERM GOAL #3   Title Improve functional activity level with patient to tolerate 60 min of sitting; 15 min of standing and 10-15 min of walking 02/03/16   Time 8   Period Weeks   Status On-going     PT LONG TERM GOAL #4   Title Independent in advanced HEP 02/03/16   Time 8   Period Weeks   Status On-going     PT LONG TERM GOAL #5   Title Improve FOTO to </= 49% limitation 02/03/16   Time 8   Period Weeks   Status On-going               Plan - 12/20/15 1632    Clinical Impression Statement Flare up of pain and muscular tightness today - may be related to sitting at her desk most of the day - less moving about and that it is Friday after a full work week. Tight through posterior Lt hip and lateral thigh with pain radiating into the Rt foot. Good response to treatment today with focus on exercise, modalities and manual work. Improving stamina for ADL's and work tasks.    Rehab Potential Good   PT Frequency 2x / week   PT Duration 8 weeks   PT Treatment/Interventions Patient/family education;Cryotherapy;Electrical Stimulation;Iontophoresis 4mg /ml Dexamethasone;Moist Heat;Ultrasound;Neuromuscular re-education;Manual techniques;Dry needling;Therapeutic  activities;Therapeutic exercise   PT Next Visit Plan manual work Lt LB and hip progress with strengthening with body weight resistance - moniter response to treatment.    PT Home Exercise Plan HEP: TENS unit    Consulted and Agree with Plan of Care Patient  Patient will benefit from skilled therapeutic intervention in order to improve the following deficits and impairments:  Postural dysfunction, Improper body mechanics, Pain, Increased fascial restricitons, Increased muscle spasms, Abnormal gait, Decreased strength, Decreased endurance, Decreased activity tolerance  Visit Diagnosis: Weakness generalized  Myalgia  Other abnormalities of gait and mobility  Other symptoms and signs involving the musculoskeletal system     Problem List Patient Active Problem List   Diagnosis Date Noted  . Pelvic relaxation 10/09/2014    Donnald Tabar Rober Minion PT, MPH  12/20/2015, 4:44 PM  [facility] [address] [place], [place], [address] Phone: [phone]   Fax:  [phone]  Name: Chelsea Fritz MRN: [id number] Date of Birth: 29-Apr-1970

## 2015-12-23 ENCOUNTER — Encounter: Payer: Self-pay | Admitting: Rehabilitative and Restorative Service Providers"

## 2015-12-25 ENCOUNTER — Encounter: Payer: Self-pay | Admitting: Rehabilitative and Restorative Service Providers"

## 2015-12-25 ENCOUNTER — Encounter (INDEPENDENT_AMBULATORY_CARE_PROVIDER_SITE_OTHER): Payer: Self-pay

## 2015-12-25 ENCOUNTER — Ambulatory Visit (INDEPENDENT_AMBULATORY_CARE_PROVIDER_SITE_OTHER): Payer: Worker's Compensation | Admitting: Rehabilitative and Restorative Service Providers"

## 2015-12-25 DIAGNOSIS — R29898 Other symptoms and signs involving the musculoskeletal system: Secondary | ICD-10-CM

## 2015-12-25 DIAGNOSIS — M791 Myalgia, unspecified site: Secondary | ICD-10-CM

## 2015-12-25 DIAGNOSIS — R531 Weakness: Secondary | ICD-10-CM | POA: Diagnosis not present

## 2015-12-25 DIAGNOSIS — R2689 Other abnormalities of gait and mobility: Secondary | ICD-10-CM

## 2015-12-25 NOTE — Therapy (Signed)
[facility] [address] [place], [place], [address] Phone: [phone]   Fax:  [phone]  Physical Therapy Treatment  Patient Details  Name: Chelsea Risingara Korinek MRN: [id number] Date of Birth: 05-14-1970 Referring Provider: Dr Venita Lickahari Brooks  Encounter Date: 12/25/2015      PT End of Session - 12/25/15 1530    Visit Number 25   Number of Visits 36   Date for PT Re-Evaluation 02/03/16   PT Start Time 1521   PT Stop Time 1615   PT Time Calculation (min) 54 min   Activity Tolerance Patient tolerated treatment well      Past Medical History:  Diagnosis Date  . Anemia   . Chronic bronchitis (HCC)   . Headache    otc med prn, sleep  . History of kidney stones    passed stone - no surgery required  . 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 Seasonal allergies   . SI (sacroiliac) pain    ablations done - SI joint - no anesthesia, darvocet only  . SVD (spontaneous vaginal delivery)    x 4    Past Surgical History:  Procedure Laterality Date  . ANTERIOR AND POSTERIOR REPAIR WITH SACROSPINOUS FIXATION N/A 10/09/2014   Procedure: ANTERIOR AND POSTERIOR REPAIR WITH SACROSPINOUS LIGAMENT SUSPENSION;  Surgeon: Harold HedgeJames Tomblin, MD;  Location: [facility];  Service: Gynecology;  Laterality: N/A;  . BILATERAL SALPINGECTOMY Bilateral 10/09/2014   Procedure: BILATERAL SALPINGECTOMY;  Surgeon: Harold HedgeJames Tomblin, MD;  Location: [facility];  Service: Gynecology;  Laterality: Bilateral;  . DILITATION & CURRETTAGE/HYSTROSCOPY WITH NOVASURE ABLATION    . KNEE SURGERY     x 2 - left  . LAPAROSCOPIC ASSISTED VAGINAL HYSTERECTOMY N/A 10/09/2014   Procedure: LAPAROSCOPIC ASSISTED VAGINAL HYSTERECTOMY;  Surgeon: Harold HedgeJames Tomblin, MD;  Location: [facility];  Service: Gynecology;  Laterality: N/A;  . NECK SURGERY     ? C4-C5  . ROTATOR CUFF REPAIR     left  . TUBAL LIGATION    . WISDOM TOOTH EXTRACTION      There were no vitals filed for  this visit.      Subjective Assessment - 12/25/15 1530    Subjective Increased pain this week - elevator is broken in her office. Back pain is back up to 5/10. Paiin in the knee is back - from walking the steps. had a couple of days this past week following last PT visit where pain was down to 1-2/10.    Currently in Pain? Yes   Pain Score 5    Pain Location Back   Pain Orientation Left   Pain Descriptors / Indicators Aching;Burning;Throbbing;Tightness   Pain Type Chronic pain   Pain Onset More than a month ago   Pain Frequency Intermittent                         [facility] Adult PT Treatment/Exercise - 12/25/15 0001      Lumbar Exercises: Stretches   Passive Hamstring Stretch 3 reps;30 seconds   ITB Stretch 30 seconds  gentle      Lumbar Exercises: Aerobic   Stationary Bike Nustep L5 x 6 min      Lumbar Exercises: Standing   Functional Squats 10 reps   Other Standing Lumbar Exercises weight shifts side to side, front to back, back to front; marching in place on blue foam pad; alt toe touch to 12 inch step  Other Standing Lumbar Exercises hip abd; hip ext; hip flexion against TB resistance - red  x 10 each LE - some discomfort Lt lateral and anterior thigh     Lumbar Exercises: Supine   Ab Set --  3 part core 10 sec x 10   Clam 10 reps     Knee/Hip Exercises: Stretches   Aeronautical engineer reps;20 seconds     Moist Heat Therapy   Number Minutes Moist Heat 15 Minutes   Moist Heat Location Lumbar Spine;Hip  Lt - (+) anterior hip      Electrical Stimulation   Electrical Stimulation Location Lt posterior and lateral hip    Electrical Stimulation Action IFC   Electrical Stimulation Parameters to tolerance   Electrical Stimulation Goals Tone;Pain     Manual Therapy   Manual Therapy Myofascial release;Soft tissue mobilization   Manual therapy comments Pt prone    Soft tissue mobilization Lt posterior hip - gluts/piriformis with trigger point work to  release tightness through musculature working into the Rt lateral hip through TFL with focus through the hamstrings and lateral thigh    Myofascial Release Lt posterior hip; hamstrings                     PT Long Term Goals - 12/16/15 1712      PT LONG TERM GOAL #1   Title Improve trunk and LE stability allowing patient to increase functional level for standing and walking 02/03/16   Time 8   Period Weeks   Status Revised     PT LONG TERM GOAL #2   Title Normal gait pattern with minimal to no pain over unlevel surfaces 02/03/16   Time 8   Period Weeks   Status On-going     PT LONG TERM GOAL #3   Title Improve functional activity level with patient to tolerate 60 min of sitting; 15 min of standing and 10-15 min of walking 02/03/16   Time 8   Period Weeks   Status On-going     PT LONG TERM GOAL #4   Title Independent in advanced HEP 02/03/16   Time 8   Period Weeks   Status On-going     PT LONG TERM GOAL #5   Title Improve FOTO to </= 49% limitation 02/03/16   Time 8   Period Weeks   Status On-going               Plan - 12/25/15 1600    Clinical Impression Statement Patient responds positively to physical therapy with decrease in pain and discomfort and improved functional activity level. Patient is progressing activity level with work and home requirements with gradual increase in endurance. She continues to have intermittent and frequent flare ups of pain requiring skilled intervention of therapist to resolve.    Rehab Potential Good   PT Frequency 2x / week   PT Duration 8 weeks   PT Treatment/Interventions Patient/family education;Cryotherapy;Electrical Stimulation;Iontophoresis 4mg /ml Dexamethasone;Moist Heat;Ultrasound;Neuromuscular re-education;Manual techniques;Dry needling;Therapeutic activities;Therapeutic exercise   PT Next Visit Plan manual work Lt LB and hip progress with strengthening with body weight resistance - moniter response to treatment.     PT Home Exercise Plan HEP: TENS unit    Consulted and Agree with Plan of Care Patient      Patient will benefit from skilled therapeutic intervention in order to improve the following deficits and impairments:  Postural dysfunction, Improper body mechanics, Pain, Increased fascial restricitons, Increased muscle spasms, Abnormal gait, Decreased  strength, Decreased endurance, Decreased activity tolerance  Visit Diagnosis: Weakness generalized  Myalgia  Other abnormalities of gait and mobility  Other symptoms and signs involving the musculoskeletal system     Problem List Patient Active Problem List   Diagnosis Date Noted  . Pelvic relaxation 10/09/2014    Chelsea Fritz PT, MPH  12/25/2015, 4:02 PM  [facility] [address] [place], [place], [address] Phone: [phone]   Fax:  [phone]  Name: Chelsea Fritz MRN: [id number] Date of Birth: Feb 02, 1971

## 2015-12-27 ENCOUNTER — Encounter: Payer: Self-pay | Admitting: Rehabilitative and Restorative Service Providers"

## 2015-12-27 ENCOUNTER — Ambulatory Visit (INDEPENDENT_AMBULATORY_CARE_PROVIDER_SITE_OTHER): Payer: Worker's Compensation | Admitting: Rehabilitative and Restorative Service Providers"

## 2015-12-27 DIAGNOSIS — M791 Myalgia, unspecified site: Secondary | ICD-10-CM

## 2015-12-27 DIAGNOSIS — R2689 Other abnormalities of gait and mobility: Secondary | ICD-10-CM | POA: Diagnosis not present

## 2015-12-27 DIAGNOSIS — R29898 Other symptoms and signs involving the musculoskeletal system: Secondary | ICD-10-CM

## 2015-12-27 DIAGNOSIS — R531 Weakness: Secondary | ICD-10-CM | POA: Diagnosis not present

## 2015-12-27 NOTE — Therapy (Signed)
[facility] [place] [address] [place], [place], [address] Phone: [phone]   Fax:  [phone]  Physical Therapy Treatment  Patient Details  Name: Kataleena Holsapple MRN: [id number] Date of Birth: March 30, 1971 Referring Provider: Dr Venita Lick  Encounter Date: 12/27/2015      PT End of Session - 12/27/15 1457    Visit Number 26   Number of Visits 36   Date for PT Re-Evaluation 02/03/16   PT Start Time 1451   PT Stop Time 1550   PT Time Calculation (min) 59 min   Activity Tolerance Patient tolerated treatment well      Past Medical History:  Diagnosis Date  . Anemia   . Chronic bronchitis (HCC)   . Headache    otc med prn, sleep  . History of kidney stones    passed stone - no surgery required  . 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 Seasonal allergies   . SI (sacroiliac) pain    ablations done - SI joint - no anesthesia, darvocet only  . SVD (spontaneous vaginal delivery)    x 4    Past Surgical History:  Procedure Laterality Date  . ANTERIOR AND POSTERIOR REPAIR WITH SACROSPINOUS FIXATION N/A 10/09/2014   Procedure: ANTERIOR AND POSTERIOR REPAIR WITH SACROSPINOUS LIGAMENT SUSPENSION;  Surgeon: Harold Hedge, MD;  Location: [facility];  Service: Gynecology;  Laterality: N/A;  . BILATERAL SALPINGECTOMY Bilateral 10/09/2014   Procedure: BILATERAL SALPINGECTOMY;  Surgeon: Harold Hedge, MD;  Location: [facility];  Service: Gynecology;  Laterality: Bilateral;  . DILITATION & CURRETTAGE/HYSTROSCOPY WITH NOVASURE ABLATION    . KNEE SURGERY     x 2 - left  . LAPAROSCOPIC ASSISTED VAGINAL HYSTERECTOMY N/A 10/09/2014   Procedure: LAPAROSCOPIC ASSISTED VAGINAL HYSTERECTOMY;  Surgeon: Harold Hedge, MD;  Location: [facility];  Service: Gynecology;  Laterality: N/A;  . NECK SURGERY     ? C4-C5  . ROTATOR CUFF REPAIR     left  . TUBAL LIGATION    . WISDOM TOOTH EXTRACTION      There were no vitals filed for  this visit.      Subjective Assessment - 12/27/15 1458    Subjective Doing pretty well today. in her office most of the day - could get up and down and did not sit the entire day. Not up and down the steps like she was earlier this week.   Currently in Pain? Yes   Pain Score 3    Pain Location Back   Pain Orientation Left   Pain Descriptors / Indicators Aching;Burning;Throbbing   Pain Type Chronic pain   Pain Radiating Towards Lt lateral hip and thigh and into the Lt groin   Pain Onset More than a month ago   Pain Frequency Intermittent                         [facility] Adult PT Treatment/Exercise - 12/27/15 0001      Lumbar Exercises: Standing   Other Standing Lumbar Exercises weight shifts side to side, front to back, back to front; marching in place on blue foam pad; alt toe touch to 12 inch step      Lumbar Exercises: Supine   Clam 10 reps   Bridge 10 reps     Knee/Hip Exercises: Standing   Forward Lunges Right;Left;2 sets;5 reps   Other Standing Knee Exercises side steps with green TB above knees 5 feet  each direction x 10    Other Standing Knee Exercises lunge - squat 5 reps each side 2 sets      Knee/Hip Exercises: Seated   Other Seated Knee/Hip Exercises sitting on exercise ball for UE theraband work including SASH, scapular retraction and horizontal abduction red TB 20 reps each engaging core      Moist Heat Therapy   Number Minutes Moist Heat 20 Minutes   Moist Heat Location Lumbar Spine;Hip  Lt - (+) anterior hip      Electrical Stimulation   Electrical Stimulation Location Lt posterior and lateral hip    Electrical Stimulation Action IFC   Electrical Stimulation Parameters to tolerance   Electrical Stimulation Goals Tone;Pain     Ultrasound   Ultrasound Location Bilat lumbar paraspinals; Lt posterior hip    Ultrasound Parameters 1.5 w/cm2; 1 mHz; 100%; 8 min    Ultrasound Goals Pain;Other (Comment)  muscular tightness      Manual Therapy    Manual Therapy Myofascial release;Soft tissue mobilization   Manual therapy comments Pt prone    Soft tissue mobilization Lt posterior hip - gluts/piriformis with trigger point work to release tightness through musculature working into the Rt lateral hip through TFL with focus through the hamstrings and lateral thigh    Myofascial Release Lt posterior hip; hamstrings                PT Education - 12/27/15 1507    Education provided Yes   Education Details HEP    Person(s) Educated Patient   Methods Explanation;Demonstration;Tactile cues;Verbal cues;Handout   Comprehension Verbalized understanding;Returned demonstration;Verbal cues required;Tactile cues required             PT Long Term Goals - 12/27/15 1629      PT LONG TERM GOAL #1   Title Improve trunk and LE stability allowing patient to increase functional level for standing and walking 02/03/16   Time 8   Period Weeks   Status On-going     PT LONG TERM GOAL #2   Title Normal gait pattern with minimal to no pain over unlevel surfaces 02/03/16   Time 8   Period Weeks   Status On-going     PT LONG TERM GOAL #3   Title Improve functional activity level with patient to tolerate 60 min of sitting; 15 min of standing and 10-15 min of walking 02/03/16   Time 8   Period Weeks   Status On-going     PT LONG TERM GOAL #4   Title Independent in advanced HEP 02/03/16   Time 8   Period Weeks   Status On-going     PT LONG TERM GOAL #5   Title Improve FOTO to </= 49% limitation 02/03/16   Time 8   Period Weeks   Status On-going               Plan - 12/27/15 1624    Clinical Impression Statement Less pain through LB and Lt LE today. Patient tolerated additioinal stabilization and strengthening exercise today. Gradual progress with exercise due to limited endurance related to return to full time work. Needs to continue with core stabilization and LE strengthening specific to areas of weakness and dysfunction to  avoid future problems and continue to progress to maximum rehab potential.    Rehab Potential Good   PT Frequency 2x / week   PT Duration 8 weeks   PT Treatment/Interventions Patient/family education;Cryotherapy;Electrical Stimulation;Iontophoresis 4mg /ml Dexamethasone;Moist Heat;Ultrasound;Neuromuscular re-education;Manual techniques;Dry needling;Therapeutic activities;Therapeutic exercise  PT Next Visit Plan manual work Lt LB and hip progress with strengthening with body weight resistance - moniter response to additional exercises - progress work on exercise ball as tolerated    Consulted and Agree with Plan of Care Patient      Patient will benefit from skilled therapeutic intervention in order to improve the following deficits and impairments:  Postural dysfunction, Improper body mechanics, Pain, Increased fascial restricitons, Increased muscle spasms, Abnormal gait, Decreased strength, Decreased endurance, Decreased activity tolerance  Visit Diagnosis: Weakness generalized  Myalgia  Other abnormalities of gait and mobility  Other symptoms and signs involving the musculoskeletal system     Problem List Patient Active Problem List   Diagnosis Date Noted  . Pelvic relaxation 10/09/2014    Jayline Kilburg Rober MinionP Leib Elahi PT, MPH  12/27/2015, 4:31 PM  [facility] [address] [place], [place], [address] Phone: [phone]   Fax:  [phone]  Name: Uvaldo Risingara Hunkins MRN: [id number] Date of Birth: [birth year]

## 2015-12-27 NOTE — Patient Instructions (Signed)
Sash    On back, knees bent, feet flat, left hand on left hip, right hand above left. Pull right arm DIAGONALLY (hip to shoulder) across chest. Bring right arm along head toward floor. Hold momentarily. Slowly return to starting position. Repeat _10__ times. Do with left arm. Band color __red____    Resisted External Rotation: in Neutral - Bilateral   PALMS UP Sit or stand, tubing in both hands, elbows at sides, bent to 90, forearms forward. Pinch shoulder blades together and rotate forearms out. Keep elbows at sides. Repeat __10__ times per set. Do _2-3___ sets per session. Do _2-3___ sessions per day.  .Marland Kitchen

## 2015-12-30 ENCOUNTER — Ambulatory Visit (INDEPENDENT_AMBULATORY_CARE_PROVIDER_SITE_OTHER): Payer: Worker's Compensation | Admitting: Rehabilitative and Restorative Service Providers"

## 2015-12-30 ENCOUNTER — Encounter: Payer: Self-pay | Admitting: Rehabilitative and Restorative Service Providers"

## 2015-12-30 DIAGNOSIS — R2689 Other abnormalities of gait and mobility: Secondary | ICD-10-CM

## 2015-12-30 DIAGNOSIS — R531 Weakness: Secondary | ICD-10-CM | POA: Diagnosis not present

## 2015-12-30 DIAGNOSIS — M791 Myalgia, unspecified site: Secondary | ICD-10-CM

## 2015-12-30 DIAGNOSIS — R29898 Other symptoms and signs involving the musculoskeletal system: Secondary | ICD-10-CM

## 2015-12-30 NOTE — Therapy (Signed)
[place], [place], [address] Phone: [phone]   Fax:  [phone]  Physical Therapy Treatment  Patient Details  Name: Chelsea Fritz MRN: [id number] Date of Birth: [birth year] Referring Provider: Dr Melina Schools  Encounter Date: 12/30/2015      PT End of Session - 12/30/15 1604    Visit Number 27   Number of Visits 36   Date for PT Re-Evaluation 02/03/16   PT Start Time 1500   PT Stop Time 1600   PT Time Calculation (min) 60 min   Activity Tolerance Patient tolerated treatment well      Past Medical History:  Diagnosis Date  . Anemia   . Chronic bronchitis ([place])   . Headache    otc med prn, sleep  . History of kidney stones    passed stone - no surgery required  . 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 Seasonal allergies   . SI (sacroiliac) pain    ablations done - SI joint - no anesthesia, darvocet only  . SVD (spontaneous vaginal delivery)    x 4    Past Surgical History:  Procedure Laterality Date  . ANTERIOR AND POSTERIOR REPAIR WITH SACROSPINOUS FIXATION N/A 10/09/2014   Procedure: ANTERIOR AND POSTERIOR REPAIR WITH SACROSPINOUS LIGAMENT SUSPENSION;  Surgeon: Everlene Farrier, MD;  Location: [facility];  Service: Gynecology;  Laterality: N/A;  . BILATERAL SALPINGECTOMY Bilateral 10/09/2014   Procedure: BILATERAL SALPINGECTOMY;  Surgeon: Everlene Farrier, MD;  Location: [facility];  Service: Gynecology;  Laterality: Bilateral;  . DILITATION & CURRETTAGE/HYSTROSCOPY WITH NOVASURE ABLATION    . KNEE SURGERY     x 2 - left  . LAPAROSCOPIC ASSISTED VAGINAL HYSTERECTOMY N/A 10/09/2014   Procedure: LAPAROSCOPIC ASSISTED VAGINAL HYSTERECTOMY;  Surgeon: Everlene Farrier, MD;  Location: [facility];  Service: Gynecology;  Laterality: N/A;  . NECK SURGERY     ? C4-C5  . ROTATOR CUFF REPAIR     left  . TUBAL LIGATION    . WISDOM TOOTH EXTRACTION      There were no vitals filed for  this visit.      Subjective Assessment - 12/30/15 1604    Subjective Not as good today as she was on Friday. Added new exercises. The band exercises were great for the mid back pain that she was having. Work on the therapy ball was good - she did not notice much soreness. Can really feel the lower abs working with exercise on therapy ball. Can tell she is making significant progress with decreasing pain and increasing strength and endurance. Has increased work and home tasks and activities.    Currently in Pain? Yes   Pain Score 3    Pain Location Back   Pain Orientation Left   Pain Descriptors / Indicators Aching;Burning;Throbbing   Pain Type Chronic pain   Pain Onset More than a month ago   Pain Frequency Constant  now intermittent in hip flexors and LE - still constant ache in the LB/SI area    Aggravating Factors  sitting; prolonged standing; prolonged walking; ascending or descending multiple stairs    Pain Relieving Factors therapy; [place]; TENS; heat; water exercise                          [facility] Adult PT Treatment/Exercise - 12/30/15 0001      Lumbar Exercises: Stretches   Passive Hamstring Stretch 3 reps;30 seconds  Passive Hamstring Stretch Limitations verbal cues to maintain good pelvic alignment    ITB Stretch 30 seconds;2 reps     Lumbar Exercises: Aerobic   Stationary Bike Nustep L4 x 6 min   started at L5 for 2 min; decreased to L4 due to discomfort     Lumbar Exercises: Standing   Functional Squats 10 reps   Other Standing Lumbar Exercises weight shifts side to side, front to back, back to front; marching in place on blue foam pad; alt toe touch to 12 inch step      Lumbar Exercises: Seated   LAQ on Ball Limitations working on therapy ball for UE exercise engaging core shd flex x 10 3 sets; shd horiz abd x 10 x 3; heel  lifts x 20; LAQ x 20 engaging core with all exercises. Trial of gentle ant/post pelvic tilt to improve lumbar moblity and gently  stretch lumbar spine following core work x 5 reps only      Lumbar Exercises: Supine   Ab Set --  3 part core throughout treatment      Moist Heat Therapy   Number Minutes Moist Heat 20 Minutes   Moist Heat Location Lumbar Spine;Hip  posterior thigh      Electrical Stimulation   Electrical Stimulation Location Lt posteior thigh; Lt lumbar and Lt hip flexors   Electrical Stimulation Action premod   Electrical Stimulation Parameters to tolerance   Electrical Stimulation Goals Tone;Pain     Ultrasound   Ultrasound Location Lt posterior thigh - mid hamstrings distal 1/2    Ultrasound Parameters 1.5 w/cm2; 1 mHz; 100%; 8 min    Ultrasound Goals Pain;Other (Comment)  muscular tightness      Manual Therapy   Manual Therapy Myofascial release;Soft tissue mobilization   Manual therapy comments Pt prone    Soft tissue mobilization Lt posterior thigh to hip into the calf - gluts/piriformis with trigger point work to release tightness through musculature working into the Rt lateral hip through TFL with focus through the hamstrings and lateral thigh    Myofascial Release Lt posterior hip; hamstrings                PT Education - 12/30/15 1700    Education provided Yes   Education Details therapy ball work - pt to check on Conservation officer, historic buildings) Educated Patient   Methods Explanation;Demonstration;Tactile cues;Verbal cues   Comprehension Verbalized understanding;Returned demonstration;Verbal cues required;Tactile cues required             PT Long Term Goals - 12/30/15 1707      PT LONG TERM GOAL #1   Title Improve trunk and LE stability allowing patient to increase functional level for standing and walking 02/03/16   Time 8   Period Weeks   Status On-going     PT LONG TERM GOAL #2   Title Normal gait pattern with minimal to no pain over unlevel surfaces 02/03/16   Time 8   Period Weeks   Status Partially Met     PT LONG TERM GOAL #3   Title Improve functional  activity level with patient to tolerate 60 min of sitting; 15 min of standing and 10-15 min of walking 02/03/16   Time 8   Period Weeks   Status Partially Met  now tolerating 60 min of sitting      PT LONG TERM GOAL #4   Title Independent in advanced HEP 02/03/16   Time 8   Period  Weeks   Status Partially Met  progressing to exercise on therapy ball      PT LONG TERM GOAL #5   Title Improve FOTO to </= 49% limitation 02/03/16   Time 8   Period Weeks   Status On-going               Plan - 12/30/15 1701    Clinical Impression Statement Continued gradual improvement with less pain and and improved mobility through trunk and LE's. Patient has progressed well with core stabilization and gaining strength through the LE's. She has returned to work full time which has limited tolerance for progressing exercise program for PT and at home. Patient ocntinues to make gains with therapy and will benefit from continued PT to progress stabilization and strength which should decrease pain and improve function. MD ordered PT to continue with PT 2 x/ wk for 8 weeks but WC approved only 4 weeks. Patient will benefit from full course of treatment to allow continued progression of functional activities and therapeytic exercise program.    Rehab Potential Good   PT Frequency 2x / week   PT Duration 8 weeks   PT Treatment/Interventions Patient/family education;Cryotherapy;Electrical Stimulation;Iontophoresis 15m/ml Dexamethasone;Moist Heat;Ultrasound;Neuromuscular re-education;Manual techniques;Dry needling;Therapeutic activities;Therapeutic exercise   PT Next Visit Plan manual work Lt LB and hip progress with strengthening with body weight resistance - moniter response to additional exercises - progress work on exercise ball which pt is tolerating well   PT Home Exercise Plan HEP: TENS unit    Consulted and Agree with Plan of Care Patient      Patient will benefit from skilled therapeutic intervention  in order to improve the following deficits and impairments:  Postural dysfunction, Improper body mechanics, Pain, Increased fascial restricitons, Increased muscle spasms, Abnormal gait, Decreased strength, Decreased endurance, Decreased activity tolerance  Visit Diagnosis: Weakness generalized  Myalgia  Other abnormalities of gait and mobility  Other symptoms and signs involving the musculoskeletal system     Problem List Patient Active Problem List   Diagnosis Date Noted  . Pelvic relaxation 10/09/2014    Eulamae Greenstein PNilda SimmerPT, MPH  12/30/2015, 5:09 PM  [facility] [address]Phone: [phone]  Fax:  [phone] Name: TAlejah AristizabalMRN: [id number] of Birth: 826-Jul-1972

## 2016-01-01 ENCOUNTER — Ambulatory Visit (INDEPENDENT_AMBULATORY_CARE_PROVIDER_SITE_OTHER): Payer: Worker's Compensation | Admitting: Rehabilitative and Restorative Service Providers"

## 2016-01-01 ENCOUNTER — Encounter: Payer: Self-pay | Admitting: Rehabilitative and Restorative Service Providers"

## 2016-01-01 DIAGNOSIS — R29898 Other symptoms and signs involving the musculoskeletal system: Secondary | ICD-10-CM | POA: Diagnosis not present

## 2016-01-01 DIAGNOSIS — M791 Myalgia, unspecified site: Secondary | ICD-10-CM

## 2016-01-01 DIAGNOSIS — R531 Weakness: Secondary | ICD-10-CM | POA: Diagnosis not present

## 2016-01-01 DIAGNOSIS — R2689 Other abnormalities of gait and mobility: Secondary | ICD-10-CM | POA: Diagnosis not present

## 2016-01-01 NOTE — Therapy (Signed)
[facility] [place], [place], [address] Phone: [phone]   Fax:  [phone]  Physical Therapy Treatment  Patient Details  Name: Chelsea Fritz MRN: [id number] Date of Birth: 05/19/1970 Referring Provider: Dr Melina Schools  Encounter Date: 01/01/2016      PT End of Session - 01/01/16 1434    Visit Number 28   Number of Visits 36   Date for PT Re-Evaluation 02/03/16   PT Start Time 1432   PT Stop Time 1530   PT Time Calculation (min) 58 min   Activity Tolerance Patient tolerated treatment well      Past Medical History:  Diagnosis Date  . Anemia   . Chronic bronchitis ([place])   . Headache    otc med prn, sleep  . History of kidney stones    passed stone - no surgery required  . 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 Seasonal allergies   . SI (sacroiliac) pain    ablations done - SI joint - no anesthesia, darvocet only  . SVD (spontaneous vaginal delivery)    x 4    Past Surgical History:  Procedure Laterality Date  . ANTERIOR AND POSTERIOR REPAIR WITH SACROSPINOUS FIXATION N/A 10/09/2014   Procedure: ANTERIOR AND POSTERIOR REPAIR WITH SACROSPINOUS LIGAMENT SUSPENSION;  Surgeon: Everlene Farrier, MD;  Location: [facility];  Service: Gynecology;  Laterality: N/A;  . BILATERAL SALPINGECTOMY Bilateral 10/09/2014   Procedure: BILATERAL SALPINGECTOMY;  Surgeon: Everlene Farrier, MD;  Location: [facility];  Service: Gynecology;  Laterality: Bilateral;  . DILITATION & CURRETTAGE/HYSTROSCOPY WITH NOVASURE ABLATION    . KNEE SURGERY     x 2 - left  . LAPAROSCOPIC ASSISTED VAGINAL HYSTERECTOMY N/A 10/09/2014   Procedure: LAPAROSCOPIC ASSISTED VAGINAL HYSTERECTOMY;  Surgeon: Everlene Farrier, MD;  Location: [facility];  Service: Gynecology;  Laterality: N/A;  . NECK SURGERY     ? C4-C5  . ROTATOR CUFF REPAIR     left  . TUBAL LIGATION    . WISDOM TOOTH EXTRACTION      There were no vitals filed for  this visit.      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 [place] of walking and getting up and down. Increased pain in the lt anterior/lateral hip.    Currently in Pain? Yes   Pain Score 5    Pain Location Back   Pain Orientation Left   Pain Descriptors / Indicators Aching;Burning;Throbbing   Pain Type Chronic pain   Pain Radiating Towards Lt lateral hip and thigh into the ITB - anterior hip/groin    Pain Onset More than a month ago   Pain Frequency Constant                         [facility] Adult PT Treatment/Exercise - 01/01/16 0001      Lumbar Exercises: Stretches   Passive Hamstring Stretch 3 reps;30 seconds   ITB Stretch 30 seconds;2 reps     Lumbar Exercises: Aerobic   Stationary Bike Nustep L4 x 5 min      Lumbar Exercises: Standing   Functional Squats 10 reps   Other Standing Lumbar Exercises weight shifts side to side, front to back, back to front; marching in place on  blue foam pad; alt toe touch to 12 inch step      Lumbar Exercises: Seated   Sit to Stand 5 reps  engaging core      Lumbar Exercises: Supine   Clam 10 reps     Moist Heat Therapy   Number Minutes Moist Heat 20 Minutes   Moist Heat Location Lumbar Spine;Hip  posterior thigh      Electrical Stimulation   Electrical Stimulation Location Lt TB band/quad   Electrical Stimulation Action IFC   Electrical Stimulation Parameters to tolerance   Electrical Stimulation Goals Tone;Pain     Ultrasound   Ultrasound Location Lt posterior hip to posterior greater trochanter    Ultrasound Parameters 1.5 w/cm2; 1 mHz; 100%; 8 min    Ultrasound Goals Pain;Other (Comment)  muscular tightness      Manual Therapy   Manual Therapy Myofascial release;Soft tissue mobilization   Manual therapy comments Pt prone and supine    Soft tissue  mobilization Bilat lumbar - Lt posterior hip and buttocks to IT band and lateral quad    Myofascial Release lumbar spine; Lt posterior hip; Lt lateral thigh                      PT Long Term Goals - 12/30/15 1707      PT LONG TERM GOAL #1   Title Improve trunk and LE stability allowing patient to increase functional level for standing and walking 02/03/16   Time 8   Period Weeks   Status On-going     PT LONG TERM GOAL #2   Title Normal gait pattern with minimal to no pain over unlevel surfaces 02/03/16   Time 8   Period Weeks   Status Partially Met     PT LONG TERM GOAL #3   Title Improve functional activity level with patient to tolerate 60 min of sitting; 15 min of standing and 10-15 min of walking 02/03/16   Time 8   Period Weeks   Status Partially Met  now tolerating 60 min of sitting      PT LONG TERM GOAL #4   Title Independent in advanced HEP 02/03/16   Time 8   Period Weeks   Status Partially Met  progressing to exercise on therapy ball      PT LONG TERM GOAL #5   Title Improve FOTO to </= 49% limitation 02/03/16   Time 8   Period Weeks   Status On-going               Plan - 01/01/16 1438    Clinical Impression Statement Flare up of symptoms related to significant increase in activity. Focus on stretching and pain management to avoid exacerbation of symptoms. Good response to modalities and manual work allowing patient to continue with exercises and ADL's - progressing gradually.    Rehab Potential Good   PT Frequency 2x / week   PT Treatment/Interventions Patient/family education;Cryotherapy;Electrical Stimulation;Iontophoresis 80m/ml Dexamethasone;Moist Heat;Ultrasound;Neuromuscular re-education;Manual techniques;Dry needling;Therapeutic activities;Therapeutic exercise   PT Next Visit Plan manual work Lt LB and hip progress with strengthening with body weight resistance - moniter response to additional exercises - progress work on exercise ball  which pt is tolerating well   Consulted and Agree with Plan of Care Patient      Patient will benefit from skilled therapeutic intervention in order to improve the following deficits and impairments:  Postural dysfunction, Improper body mechanics, Pain, Increased fascial restricitons, Increased muscle spasms, Abnormal gait,  Decreased strength, Decreased endurance, Decreased activity tolerance  Visit Diagnosis: Weakness generalized  Myalgia  Other abnormalities of gait and mobility  Other symptoms and signs involving the musculoskeletal system     Problem List Patient Active Problem List   Diagnosis Date Noted  . Pelvic relaxation 10/09/2014    Yuliza Cara Nilda Simmer PT, MPH  01/01/2016, 3:21 PM  [facility] [address] [place], [place], [address] Phone: [phone]   Fax:  [phone]  Name: Chelsea Fritz MRN: [id number] Date of Birth: Jul 29, 1970

## 2016-01-06 ENCOUNTER — Ambulatory Visit (INDEPENDENT_AMBULATORY_CARE_PROVIDER_SITE_OTHER): Payer: Worker's Compensation | Admitting: Rehabilitative and Restorative Service Providers"

## 2016-01-06 ENCOUNTER — Encounter: Payer: Self-pay | Admitting: Rehabilitative and Restorative Service Providers"

## 2016-01-06 DIAGNOSIS — M791 Myalgia, unspecified site: Secondary | ICD-10-CM

## 2016-01-06 DIAGNOSIS — R2689 Other abnormalities of gait and mobility: Secondary | ICD-10-CM

## 2016-01-06 DIAGNOSIS — R29898 Other symptoms and signs involving the musculoskeletal system: Secondary | ICD-10-CM | POA: Diagnosis not present

## 2016-01-06 DIAGNOSIS — R531 Weakness: Secondary | ICD-10-CM

## 2016-01-06 NOTE — Therapy (Signed)
[place], [place], [address] Phone: [phone]   Fax:  [phone]  Physical Therapy Treatment  Patient Details  Name: Chelsea Fritz MRN: [id number] Date of Birth: 02-20-1971 Referring Provider: Dr Melina Schools  Encounter Date: 01/06/2016      PT End of Session - 01/06/16 1437    Visit Number 29   Number of Visits 36   Date for PT Re-Evaluation 02/03/16   PT Start Time 1432   PT Stop Time 1529   PT Time Calculation (min) 57 min   Activity Tolerance Patient tolerated treatment well      Past Medical History:  Diagnosis Date  . Anemia   . Chronic bronchitis ([place])   . Headache    otc med prn, sleep  . History of kidney stones    passed stone - no surgery required  . 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 Seasonal allergies   . SI (sacroiliac) pain    ablations done - SI joint - no anesthesia, darvocet only  . SVD (spontaneous vaginal delivery)    x 4    Past Surgical History:  Procedure Laterality Date  . ANTERIOR AND POSTERIOR REPAIR WITH SACROSPINOUS FIXATION N/A 10/09/2014   Procedure: ANTERIOR AND POSTERIOR REPAIR WITH SACROSPINOUS LIGAMENT SUSPENSION;  Surgeon: Everlene Farrier, MD;  Location: [facility];  Service: Gynecology;  Laterality: N/A;  . BILATERAL SALPINGECTOMY Bilateral 10/09/2014   Procedure: BILATERAL SALPINGECTOMY;  Surgeon: Everlene Farrier, MD;  Location: [facility];  Service: Gynecology;  Laterality: Bilateral;  . DILITATION & CURRETTAGE/HYSTROSCOPY WITH NOVASURE ABLATION    . KNEE SURGERY     x 2 - left  . LAPAROSCOPIC ASSISTED VAGINAL HYSTERECTOMY N/A 10/09/2014   Procedure: LAPAROSCOPIC ASSISTED VAGINAL HYSTERECTOMY;  Surgeon: Everlene Farrier, MD;  Location: [facility];  Service: Gynecology;  Laterality: N/A;  . NECK SURGERY     ? C4-C5  . ROTATOR CUFF REPAIR     left  . TUBAL LIGATION    . WISDOM TOOTH EXTRACTION      There were no vitals filed for  this visit.      Subjective Assessment - 01/06/16 1438    Subjective Doing well today. Pretty good today. Working on her exercises over the weekend. Good day at work - no stairs. Mostly meetings and work in [employer]. No lifting and carrying etc...    Currently in Pain? Yes   Pain Score 2    Pain Location Back   Pain Orientation Lower;Left   Pain Descriptors / Indicators Aching;Dull   Pain Type Chronic pain   Pain Onset More than a month ago   Pain Frequency Constant                         [facility] Adult PT Treatment/Exercise - 01/06/16 0001      Lumbar Exercises: Aerobic   Stationary Bike Nustep L4 x 5 min    UBE (Upper Arm Bike) L2 3 min alt/fwd/back      Lumbar Exercises: Seated   Long Arc Quad on [place] Left;Right;2 sets;10 reps   Hip Flexion on Ball Right;Left;10 reps   Hip Flexion on Ball Limitations diagonal yellow TB x 20      Lumbar Exercises: Prone   Other Prone Lumbar Exercises prone on therapy ball for alt hip/LE extension x 10; alt shoulder flexion x  10 each      Moist Heat  Therapy   Number Minutes Moist Heat 20 Minutes   Moist Heat Location Lumbar Spine;Hip  posterior thigh      Electrical Stimulation   Electrical Stimulation Location bilat lumbar Lt hip laterally and hip flexors    Electrical Stimulation Action IFC   Electrical Stimulation Parameters to tolerance   Electrical Stimulation Goals Tone;Pain     Manual Therapy   Manual Therapy Myofascial release;Soft tissue mobilization   Manual therapy comments pt prone    Soft tissue mobilization Bilat lumbar - Lt posterior hip and buttocks to IT band and lateral quad    Myofascial Release lumbar spine; Lt posterior hip; Lt lateral thigh                      PT Long Term Goals - 01/06/16 1519      PT LONG TERM GOAL #1   Title Improve trunk and LE stability allowing patient to increase functional level for standing and walking 02/03/16   Time 8   Period Weeks   Status  Partially Met     PT LONG TERM GOAL #2   Title Normal gait pattern with minimal to no pain over unlevel surfaces 02/03/16   Time 8   Period Weeks   Status Partially Met     PT LONG TERM GOAL #3   Title Improve functional activity level with patient to tolerate 60 min of sitting; 15 min of standing and 10-15 min of walking 02/03/16   Time 8   Period Weeks   Status Partially Met     PT LONG TERM GOAL #4   Title Independent in advanced HEP 02/03/16   Time 8   Period Weeks   Status On-going     PT LONG TERM GOAL #5   Title Improve FOTO to </= 49% limitation 02/03/16   Time 8   Status On-going               Plan - 01/06/16 1517    Clinical Impression Statement Improved pain level today. Increased activity level. Tolerated new exercises for stabilization on the therapy ball well - some pain in the anterior hip/flexors. Good response to manual work and modalities. Decreased muscular tightness and improved tolerance for deep tissue work.    Rehab Potential Good   PT Frequency 2x / week   PT Duration 8 weeks   PT Treatment/Interventions Patient/family education;Cryotherapy;Electrical Stimulation;Iontophoresis 105m/ml Dexamethasone;Moist Heat;Ultrasound;Neuromuscular re-education;Manual techniques;Dry needling;Therapeutic activities;Therapeutic exercise   PT Next Visit Plan manual work Lt LB and hip progress with strengthening with body weight resistance - moniter response to additional exercises - progress work on exercise ball which pt is tolerating well   Consulted and Agree with Plan of Care Patient      Patient will benefit from skilled therapeutic intervention in order to improve the following deficits and impairments:  Postural dysfunction, Improper body mechanics, Pain, Increased fascial restricitons, Increased muscle spasms, Abnormal gait, Decreased strength, Decreased endurance, Decreased activity tolerance  Visit Diagnosis: Weakness generalized  Myalgia  Other  abnormalities of gait and mobility  Other symptoms and signs involving the musculoskeletal system     Problem List Patient Active Problem List   Diagnosis Date Noted  . Pelvic relaxation 10/09/2014    Riaz Onorato PNilda SimmerPT, MPH  01/06/2016, 3:22 PM  [facility] [address]Phone: [phone]  Fax:  [phone] Name: Chelsea SovaMRN: [id number] of Birth: 809/10/1970

## 2016-01-08 ENCOUNTER — Encounter: Payer: Self-pay | Admitting: Rehabilitative and Restorative Service Providers"

## 2016-01-08 ENCOUNTER — Ambulatory Visit (INDEPENDENT_AMBULATORY_CARE_PROVIDER_SITE_OTHER): Payer: Worker's Compensation | Admitting: Rehabilitative and Restorative Service Providers"

## 2016-01-08 DIAGNOSIS — R2689 Other abnormalities of gait and mobility: Secondary | ICD-10-CM

## 2016-01-08 DIAGNOSIS — R29898 Other symptoms and signs involving the musculoskeletal system: Secondary | ICD-10-CM

## 2016-01-08 DIAGNOSIS — R531 Weakness: Secondary | ICD-10-CM | POA: Diagnosis not present

## 2016-01-08 DIAGNOSIS — M791 Myalgia, unspecified site: Secondary | ICD-10-CM

## 2016-01-08 NOTE — Therapy (Signed)
[facility] [place] [address] [place], [place], [address] Phone: [phone]   Fax:  [phone]  Physical Therapy Treatment  Patient Details  Name: Chelsea Fritz MRN: [id number] Date of Birth: 11-Oct-1970 Referring Provider: Dr Venita Lick  Encounter Date: 01/08/2016      PT End of Session - 01/08/16 1446    Visit Number 30   Number of Visits 36   Date for PT Re-Evaluation 02/03/16   PT Start Time 1435   PT Stop Time 1538   PT Time Calculation (min) 63 min   Activity Tolerance Patient tolerated treatment well      Past Medical History:  Diagnosis Date  . Anemia   . Chronic bronchitis ([facility])   . Headache    otc med prn, sleep  . History of kidney stones    passed stone - no surgery required  . 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 Seasonal allergies   . SI (sacroiliac) pain    ablations done - SI joint - no anesthesia, darvocet only  . SVD (spontaneous vaginal delivery)    x 4    Past Surgical History:  Procedure Laterality Date  . ANTERIOR AND POSTERIOR REPAIR WITH SACROSPINOUS FIXATION N/A 10/09/2014   Procedure: ANTERIOR AND POSTERIOR REPAIR WITH SACROSPINOUS LIGAMENT SUSPENSION;  Surgeon: Harold Hedge, MD;  Location: [facility];  Service: Gynecology;  Laterality: N/A;  . BILATERAL SALPINGECTOMY Bilateral 10/09/2014   Procedure: BILATERAL SALPINGECTOMY;  Surgeon: Harold Hedge, MD;  Location: [facility];  Service: Gynecology;  Laterality: Bilateral;  . DILITATION & CURRETTAGE/HYSTROSCOPY WITH NOVASURE ABLATION    . KNEE SURGERY     x 2 - left  . LAPAROSCOPIC ASSISTED VAGINAL HYSTERECTOMY N/A 10/09/2014   Procedure: LAPAROSCOPIC ASSISTED VAGINAL HYSTERECTOMY;  Surgeon: Harold Hedge, MD;  Location: [facility];  Service: Gynecology;  Laterality: N/A;  . NECK SURGERY     ? C4-C5  . ROTATOR CUFF REPAIR     left  . TUBAL LIGATION    . WISDOM TOOTH EXTRACTION      There were no vitals filed for  this visit.                       [facility] Adult PT Treatment/Exercise - 01/08/16 0001      Lumbar Exercises: Stretches   Passive Hamstring Stretch 3 reps;30 seconds   Quad Stretch 30 seconds;3 reps     Lumbar Exercises: Aerobic   Stationary Bike Nustep L4 x 5 min      Moist Heat Therapy   Number Minutes Moist Heat 20 Minutes   Moist Heat Location Lumbar Spine;Hip  posterior thigh      Electrical Stimulation   Electrical Stimulation Location bilat lumbar Lt hip laterally and hip flexors    Electrical Stimulation Action IFC   Electrical Stimulation Parameters to tolerance   Electrical Stimulation Goals Tone;Pain     Manual Therapy   Manual Therapy Myofascial release;Soft tissue mobilization   Manual therapy comments pt prone/supine   Soft tissue mobilization Bilat lumbar - Lt posterior hip and buttocks to IT band and lateral quad; quads   Myofascial Release lumbar spine; Lt posterior hip; Lt lateral thigh           Trigger Point Dry Needling - 01/08/16 1513    Consent Given? Yes   Gluteus Maximus Response Palpable increased muscle length   Gluteus Minimus Response Palpable increased muscle length   Piriformis  Response Palpable increased muscle length   Tensor Fascia Lata Response Palpable increased muscle length   Quadriceps Response Palpable increased muscle length                   PT Long Term Goals - 01/06/16 1519      PT LONG TERM GOAL #1   Title Improve trunk and LE stability allowing patient to increase functional level for standing and walking 02/03/16   Time 8   Period Weeks   Status Partially Met     PT LONG TERM GOAL #2   Title Normal gait pattern with minimal to no pain over unlevel surfaces 02/03/16   Time 8   Period Weeks   Status Partially Met     PT LONG TERM GOAL #3   Title Improve functional activity level with patient to tolerate 60 min of sitting; 15 min of standing and 10-15 min of walking 02/03/16   Time 8   Period  Weeks   Status Partially Met     PT LONG TERM GOAL #4   Title Independent in advanced HEP 02/03/16   Time 8   Period Weeks   Status On-going     PT LONG TERM GOAL #5   Title Improve FOTO to </= 49% limitation 02/03/16   Time 8   Status On-going             Patient will benefit from skilled therapeutic intervention in order to improve the following deficits and impairments:     Visit Diagnosis: Weakness generalized  Myalgia  Other abnormalities of gait and mobility  Other symptoms and signs involving the musculoskeletal system     Problem List Patient Active Problem List   Diagnosis Date Noted  . Pelvic relaxation 10/09/2014    Chelsea Fritz Nilda Simmer PT, MPH  01/08/2016, 3:17 PM  [facility] [place], [place], [address] Phone: [phone]   Fax:  [phone]  Name: Chelsea Fritz MRN: [id number] Date of Birth: 1970-06-18

## 2016-01-13 ENCOUNTER — Encounter: Payer: Self-pay | Admitting: Rehabilitative and Restorative Service Providers"

## 2016-01-15 ENCOUNTER — Ambulatory Visit (INDEPENDENT_AMBULATORY_CARE_PROVIDER_SITE_OTHER): Payer: Worker's Compensation | Admitting: Rehabilitative and Restorative Service Providers"

## 2016-01-15 ENCOUNTER — Encounter: Payer: Self-pay | Admitting: Rehabilitative and Restorative Service Providers"

## 2016-01-15 DIAGNOSIS — R29898 Other symptoms and signs involving the musculoskeletal system: Secondary | ICD-10-CM | POA: Diagnosis not present

## 2016-01-15 DIAGNOSIS — R2689 Other abnormalities of gait and mobility: Secondary | ICD-10-CM

## 2016-01-15 DIAGNOSIS — M791 Myalgia, unspecified site: Secondary | ICD-10-CM

## 2016-01-15 DIAGNOSIS — R531 Weakness: Secondary | ICD-10-CM | POA: Diagnosis not present

## 2016-01-15 NOTE — Therapy (Signed)
[place], [place], [address] Phone: [phone]   Fax:  [phone]  Physical Therapy Treatment  Patient Details  Name: Chelsea Fritz MRN: [id number] Date of Birth: [birth year] Referring Provider: Dr Melina Schools  Encounter Date: 01/15/2016      PT End of Session - 01/15/16 1448    Visit Number 31   Number of Visits 36   Date for PT Re-Evaluation 02/03/16   PT Start Time 4580   PT Stop Time 1530   PT Time Calculation (min) 56 min   Activity Tolerance Patient tolerated treatment well      Past Medical History:  Diagnosis Date  . Anemia   . Chronic bronchitis ([place])   . Headache    otc med prn, sleep  . History of kidney stones    passed stone - no surgery required  . 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 Seasonal allergies   . SI (sacroiliac) pain    ablations done - SI joint - no anesthesia, darvocet only  . SVD (spontaneous vaginal delivery)    x 4    Past Surgical History:  Procedure Laterality Date  . ANTERIOR AND POSTERIOR REPAIR WITH SACROSPINOUS FIXATION N/A 10/09/2014   Procedure: ANTERIOR AND POSTERIOR REPAIR WITH SACROSPINOUS LIGAMENT SUSPENSION;  Surgeon: Everlene Farrier, MD;  Location: [facility];  Service: Gynecology;  Laterality: N/A;  . BILATERAL SALPINGECTOMY Bilateral 10/09/2014   Procedure: BILATERAL SALPINGECTOMY;  Surgeon: Everlene Farrier, MD;  Location: [facility];  Service: Gynecology;  Laterality: Bilateral;  . DILITATION & CURRETTAGE/HYSTROSCOPY WITH NOVASURE ABLATION    . KNEE SURGERY     x 2 - left  . LAPAROSCOPIC ASSISTED VAGINAL HYSTERECTOMY N/A 10/09/2014   Procedure: LAPAROSCOPIC ASSISTED VAGINAL HYSTERECTOMY;  Surgeon: Everlene Farrier, MD;  Location: [facility];  Service: Gynecology;  Laterality: N/A;  . NECK SURGERY     ? C4-C5  . ROTATOR CUFF REPAIR     left  . TUBAL LIGATION    . WISDOM TOOTH EXTRACTION      There were no vitals filed for  this visit.      Subjective Assessment - 01/15/16 1449    Subjective In a conference Monday - sitting more. Anterior hip is tight and angry.    Currently in Pain? Yes   Pain Score 3    Pain Location Back   Pain Orientation Left;Lower   Pain Descriptors / Indicators Aching;Dull                         [facility] Adult PT Treatment/Exercise - 01/15/16 0001      Therapeutic Activites    Therapeutic Activities --  problem solving for hip flexor stretch when traveling      Neuro Re-ed    Neuro Re-ed Details  working on pelvic alignment in SLS engaging core and avoiding lateral hip deviation      Lumbar Exercises: Stretches   Standing Extension Limitations hip extension w/ knee flexed to ~90 deg for hip flexor stretch on Lt 30 sec x 3    Quad Stretch 30 seconds;3 reps     Lumbar Exercises: Aerobic   Stationary Bike Nustep L5 x 3.5 min L4 x 1.5 min      Lumbar Exercises: Standing   Forward Lunge 10 reps;2 seconds  Lt/Rt    Other Standing Lumbar Exercises weight shifts side to side, front to back, back to front;  marching in place on blue foam pad; alt toe touch to 12 inch step      Moist Heat Therapy   Number Minutes Moist Heat 20 Minutes   Moist Heat Location Lumbar Spine;Hip  posterior thigh      Electrical Stimulation   Electrical Stimulation Location bilat lumbar Lt hip laterally and hip flexors    Electrical Stimulation Action IFC   Electrical Stimulation Parameters to tolerance   Electrical Stimulation Goals Tone;Pain     Ultrasound   Ultrasound Location Lt posterior hip/bilat lumbar    Ultrasound Parameters 1.5 w/cm 2; 100%; 1 mHz 8 min    Ultrasound Goals Pain;Other (Comment)  tightness      Manual Therapy   Manual Therapy Myofascial release;Soft tissue mobilization   Manual therapy comments pt prone/supine   Soft tissue mobilization Bilat lumbar - Lt posterior hip and buttocks to IT band and lateral quad; quads   Myofascial Release lumbar spine; Lt  posterior hip; Lt lateral thigh           Trigger Point Dry Needling - 01/15/16 1551    Consent Given? Yes   Gluteus Maximus Response Palpable increased muscle length   Piriformis Response Palpable increased muscle length              PT Education - 01/15/16 1553    Education provided Yes   Education Details hip flexor stretch in standing    Person(s) Educated Patient   Methods Explanation;Demonstration;Tactile cues;Verbal cues   Comprehension Verbalized understanding;Returned demonstration;Verbal cues required;Tactile cues required             PT Long Term Goals - 01/06/16 1519      PT LONG TERM GOAL #1   Title Improve trunk and LE stability allowing patient to increase functional level for standing and walking 02/03/16   Time 8   Period Weeks   Status Partially Met     PT LONG TERM GOAL #2   Title Normal gait pattern with minimal to no pain over unlevel surfaces 02/03/16   Time 8   Period Weeks   Status Partially Met     PT LONG TERM GOAL #3   Title Improve functional activity level with patient to tolerate 60 min of sitting; 15 min of standing and 10-15 min of walking 02/03/16   Time 8   Period Weeks   Status Partially Met     PT LONG TERM GOAL #4   Title Independent in advanced HEP 02/03/16   Time 8   Period Weeks   Status On-going     PT LONG TERM GOAL #5   Title Improve FOTO to </= 49% limitation 02/03/16   Time 8   Status On-going               Plan - 01/15/16 1551    Clinical Impression Statement Continued gradual progress with patient demonstrating improved gait and posture. She reports increased activity level at home. She is working on exercises at home varying exercises some daily with some stretches and exercises done daily. Activity tolerance is improving. Progressing well toward stated goals of therapy.    Rehab Potential Good   PT Frequency 2x / week   PT Duration 8 weeks   PT Treatment/Interventions Patient/family  education;Cryotherapy;Electrical Stimulation;Iontophoresis '4mg'$ /ml Dexamethasone;Moist Heat;Ultrasound;Neuromuscular re-education;Manual techniques;Dry needling;Therapeutic activities;Therapeutic exercise   PT Next Visit Plan manual work Lt LB and hip progress with strengthening with body weight resistance - moniter response to additional exercises - progress work on  exercise ball which pt is tolerating well   PT Home Exercise Plan HEP: TENS unit       Patient will benefit from skilled therapeutic intervention in order to improve the following deficits and impairments:  Postural dysfunction, Improper body mechanics, Pain, Increased fascial restricitons, Increased muscle spasms, Abnormal gait, Decreased strength, Decreased endurance, Decreased activity tolerance  Visit Diagnosis: Weakness generalized  Myalgia  Other abnormalities of gait and mobility  Other symptoms and signs involving the musculoskeletal system     Problem List Patient Active Problem List   Diagnosis Date Noted  . Pelvic relaxation 10/09/2014    Celyn Nilda Simmer PT, MPH  01/15/2016, 3:57 PM  [facility] [place], [place], [address] Phone: [phone]   Fax:  [phone]  Name: Chelsea Fritz MRN: [id number] Date of Birth: [birth year]

## 2016-01-20 ENCOUNTER — Encounter: Payer: Worker's Compensation | Admitting: Rehabilitative and Restorative Service Providers"

## 2016-01-22 ENCOUNTER — Ambulatory Visit (INDEPENDENT_AMBULATORY_CARE_PROVIDER_SITE_OTHER): Payer: Worker's Compensation | Admitting: Rehabilitative and Restorative Service Providers"

## 2016-01-22 ENCOUNTER — Encounter: Payer: Self-pay | Admitting: Rehabilitative and Restorative Service Providers"

## 2016-01-22 DIAGNOSIS — R2689 Other abnormalities of gait and mobility: Secondary | ICD-10-CM

## 2016-01-22 DIAGNOSIS — R531 Weakness: Secondary | ICD-10-CM | POA: Diagnosis not present

## 2016-01-22 DIAGNOSIS — M791 Myalgia, unspecified site: Secondary | ICD-10-CM

## 2016-01-22 DIAGNOSIS — R29898 Other symptoms and signs involving the musculoskeletal system: Secondary | ICD-10-CM

## 2016-01-22 NOTE — Therapy (Signed)
[facility] [address] [place], [place], [address] Phone: [phone]   Fax:  [phone]  Physical Therapy Treatment  Patient Details  Name: Chelsea Fritz MRN: [id number] Date of Birth: 10/04/1970 Referring Provider: dr Dahari Brooks  Encounter Date: 01/22/2016      PT End of Session - 01/22/16 1441    Visit Number 32   Number of Visits 36   Date for PT Re-Evaluation 02/03/16   PT Start Time 1437   PT Stop Time 1535   PT Time Calculation (min) 58 min   Activity Tolerance Patient tolerated treatment well      Past Medical History:  Diagnosis Date  . Anemia   . Chronic bronchitis (HCC)   . Headache    otc med prn, sleep  . History of kidney stones    passed stone - no surgery required  . 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 Seasonal allergies   . SI (sacroiliac) pain    ablations done - SI joint - no anesthesia, darvocet only  . SVD (spontaneous vaginal delivery)    x 4    Past Surgical History:  Procedure Laterality Date  . ANTERIOR AND POSTERIOR REPAIR WITH SACROSPINOUS FIXATION N/A 10/09/2014   Procedure: ANTERIOR AND POSTERIOR REPAIR WITH SACROSPINOUS LIGAMENT SUSPENSION;  Surgeon: James Tomblin, MD;  Location: [facility];  Service: Gynecology;  Laterality: N/A;  . BILATERAL SALPINGECTOMY Bilateral 10/09/2014   Procedure: BILATERAL SALPINGECTOMY;  Surgeon: James Tomblin, MD;  Location: [facility];  Service: Gynecology;  Laterality: Bilateral;  . DILITATION & CURRETTAGE/HYSTROSCOPY WITH NOVASURE ABLATION    . KNEE SURGERY     x 2 - left  . LAPAROSCOPIC ASSISTED VAGINAL HYSTERECTOMY N/A 10/09/2014   Procedure: LAPAROSCOPIC ASSISTED VAGINAL HYSTERECTOMY;  Surgeon: James Tomblin, MD;  Location: [facility];  Service: Gynecology;  Laterality: N/A;  . NECK SURGERY     ? C4-C5  . ROTATOR CUFF REPAIR     left  . TUBAL LIGATION    . WISDOM TOOTH EXTRACTION      There were no vitals filed for  this visit.      Subjective Assessment - 01/22/16 1651    Subjective Patient reports she was in the floor for 45 minutes part of which time she was in a forward flexed posture on knees with head down for 15 min. She has had increased pain since that time. RTD Friday 01/24/16   Currently in Pain? Yes   Pain Score 5    Pain Location Back   Pain Orientation Left;Lower   Pain Descriptors / Indicators Aching;Dull   Pain Type Chronic pain   Pain Onset More than a month ago   Pain Frequency Constant            [facility] PT Assessment - 01/22/16 0001      Assessment   Medical Diagnosis SI dysfunction    Referring Provider dr Dahari Brooks   Onset Date/Surgical Date 07/15/10   Hand Dominance Left   Next MD Visit 11/17     Flexibility   Hamstrings LLE 95 deg, RLE 99 deg.    Quadriceps Lt 127 deg, Rt 134 deg.      Palpation   Palpation comment tendernes and tightness through sacrum; Lt > Rt SI area; Lt posterior hip musculature into the Lt hamstrings and IT band; Lt iliopsoas and hip flexors, adductors, groin                        [facility] Adult PT Treatment/Exercise - 01/22/16 0001      Lumbar Exercises: Stretches   Passive Hamstring Stretch 3 reps;30 seconds   Standing Extension Limitations hip extension w/ knee flexed to ~90 deg for hip flexor stretch on Lt 30 sec x 3    Quad Stretch 30 seconds;3 reps     Lumbar Exercises: Aerobic   Stationary Bike Nustep L5 x 2 min L4 x 3 min      Lumbar Exercises: Standing   Other Standing Lumbar Exercises weight shifts side to side, front to back, back to front; marching in place on blue foam pad; alt toe touch to 12 inch step      Moist Heat Therapy   Number Minutes Moist Heat 20 Minutes   Moist Heat Location Lumbar Spine;Hip  posterior thigh      Electrical Stimulation   Electrical Stimulation Location bilat lumbar Lt hip laterally and hip flexors    Electrical Stimulation Action IFC   Electrical Stimulation Parameters to  tolerance   Electrical Stimulation Goals Tone;Pain     Ultrasound   Ultrasound Location Lt lumbar    Ultrasound Parameters 1.5 w/cm2; 100%; 1 mHz; 8 min   Ultrasound Goals Pain;Other (Comment)  tightness      Manual Therapy   Manual Therapy Myofascial release;Soft tissue mobilization   Manual therapy comments pt prone/supine   Soft tissue mobilization Bilat lumbar - Lt posterior hip and buttocks to IT band and lateral quad; quads   Myofascial Release lumbar spine; Lt posterior hip; Lt lateral thigh                      PT Long Term Goals - 01/22/16 1702      PT LONG TERM GOAL #1   Title Improve trunk and LE stability allowing patient to increase functional level for standing and walking 02/03/16   Time 8   Period Weeks   Status Partially Met     PT LONG TERM GOAL #2   Title Normal gait pattern with minimal to no pain over unlevel surfaces 02/03/16   Time 8   Period Weeks   Status Partially Met     PT LONG TERM GOAL #3   Title Improve functional activity level with patient to tolerate 60 min of sitting; 15 min of standing and 10-15 min of walking 02/03/16   Time 8   Period Weeks   Status Partially Met     PT LONG TERM GOAL #4   Title Independent in advanced HEP 02/03/16   Time 8   Period Weeks   Status Partially Met     PT LONG TERM GOAL #5   Title Improve FOTO to </= 49% limitation 02/03/16   Time 8   Period Weeks   Status On-going               Plan - 01/22/16 1702    Clinical Impression Statement Patient continues to demonstrate improvement in functional activity level. She has improved exercise and activity tolerance. Patient continues to experience flare ups in pain related to over use and increased activity level. she has increased pain with stressful or different activities. Chelsea is working on exercise consistently at home. Focus in the clinic is on progression of HEP and on manual work including TDN as well as modalities to manage pain and  improve tissue extensibility. Will benefit from continued PT to further functional level and chieve maximum rehab potential. Excellent progress overall.      Rehab Potential Good   PT Frequency 2x / week   PT Duration 8 weeks   PT Treatment/Interventions Patient/family education;Cryotherapy;Electrical Stimulation;Iontophoresis 58m/ml Dexamethasone;Moist Heat;Ultrasound;Neuromuscular re-education;Manual techniques;Dry needling;Therapeutic activities;Therapeutic exercise   PT Next Visit Plan manual work Lt LB and hip progress with strengthening with body weight resistance - moniter response to additional exercises - progress work on exercise ball which pt is tolerating well   Consulted and Agree with Plan of Care Patient      Patient will benefit from skilled therapeutic intervention in order to improve the following deficits and impairments:  Postural dysfunction, Improper body mechanics, Pain, Increased fascial restricitons, Increased muscle spasms, Abnormal gait, Decreased strength, Decreased endurance, Decreased activity tolerance  Visit Diagnosis: Weakness generalized  Myalgia  Other abnormalities of gait and mobility  Other symptoms and signs involving the musculoskeletal system     Problem List Patient Active Problem List   Diagnosis Date Noted  . Pelvic relaxation 10/09/2014    Gurjot Brisco PNilda SimmerPT, MPH  01/22/2016, 5:09 PM  [facility] [address]Phone: [phone]  Fax:  [phone] Name: Chelsea AyarsMRN: [id number] of Birth: 8October 30, 1972

## 2016-01-27 ENCOUNTER — Encounter: Payer: Self-pay | Admitting: Rehabilitative and Restorative Service Providers"

## 2016-01-29 ENCOUNTER — Ambulatory Visit (INDEPENDENT_AMBULATORY_CARE_PROVIDER_SITE_OTHER): Payer: BC Managed Care – PPO | Admitting: Physical Therapy

## 2016-01-29 DIAGNOSIS — M791 Myalgia, unspecified site: Secondary | ICD-10-CM

## 2016-01-29 DIAGNOSIS — R531 Weakness: Secondary | ICD-10-CM | POA: Diagnosis not present

## 2016-01-29 DIAGNOSIS — R2689 Other abnormalities of gait and mobility: Secondary | ICD-10-CM | POA: Diagnosis not present

## 2016-01-29 DIAGNOSIS — R29898 Other symptoms and signs involving the musculoskeletal system: Secondary | ICD-10-CM | POA: Diagnosis not present

## 2016-01-29 NOTE — Therapy (Signed)
[place], [place], [address] Phone: [phone]   Fax:  [phone]  Physical Therapy Treatment  Patient Details  Name: Chelsea Fritz MRN: [id number] Date of Birth: 1970-07-27 Referring Provider: Dr. Melina Schools   Encounter Date: 01/29/2016      PT End of Session - 01/29/16 1438    Visit Number 33   Number of Visits 38   Date for PT Re-Evaluation 02/03/16   PT Start Time 1432   PT Stop Time 1545   PT Time Calculation (min) 73 min   Activity Tolerance Patient tolerated treatment well   Behavior During Therapy [facility] for tasks assessed/performed      Past Medical History:  Diagnosis Date  . Anemia   . Chronic bronchitis ([place])   . Headache    otc med prn, sleep  . History of kidney stones    passed stone - no surgery required  . 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 Seasonal allergies   . SI (sacroiliac) pain    ablations done - SI joint - no anesthesia, darvocet only  . SVD (spontaneous vaginal delivery)    x 4    Past Surgical History:  Procedure Laterality Date  . ANTERIOR AND POSTERIOR REPAIR WITH SACROSPINOUS FIXATION N/A 10/09/2014   Procedure: ANTERIOR AND POSTERIOR REPAIR WITH SACROSPINOUS LIGAMENT SUSPENSION;  Surgeon: Everlene Farrier, MD;  Location: [facility];  Service: Gynecology;  Laterality: N/A;  . BILATERAL SALPINGECTOMY Bilateral 10/09/2014   Procedure: BILATERAL SALPINGECTOMY;  Surgeon: Everlene Farrier, MD;  Location: [facility];  Service: Gynecology;  Laterality: Bilateral;  . DILITATION & CURRETTAGE/HYSTROSCOPY WITH NOVASURE ABLATION    . KNEE SURGERY     x 2 - left  . LAPAROSCOPIC ASSISTED VAGINAL HYSTERECTOMY N/A 10/09/2014   Procedure: LAPAROSCOPIC ASSISTED VAGINAL HYSTERECTOMY;  Surgeon: Everlene Farrier, MD;  Location: [facility];  Service: Gynecology;  Laterality: N/A;  . NECK SURGERY     ? C4-C5  . ROTATOR CUFF REPAIR     left  . TUBAL LIGATION    .  WISDOM TOOTH EXTRACTION      There were no vitals filed for this visit.      Subjective Assessment - 01/29/16 1439    Subjective Pt reports she has been released from the surgeon.  She is interested in being able to walk for fitness.  She has difficulty with stairs.    Currently in Pain? Yes   Pain Score 3    Pain Location Hip   Pain Orientation Left  along with dull ache across back   Aggravating Factors  sitting, standing, stairs   Pain Relieving Factors [place], TENS, water exercise.             [facility] PT Assessment - 01/29/16 0001      Assessment   Medical Diagnosis SI dysfunction    Referring Provider Dr. Melina Schools    Onset Date/Surgical Date 07/15/10   Hand Dominance Left     Observation/Other Assessments   Focus on Therapeutic Outcomes (FOTO)  50% limited          [facility] Adult PT Treatment/Exercise - 01/29/16 0001      Lumbar Exercises: Stretches   Passive Hamstring Stretch 3 reps;30 seconds     Lumbar Exercises: Aerobic   Stationary Bike Nustep L5 x 2 min L4 x 3 min    Tread Mill 4 min - @ 2.2-2.7 mph     Lumbar  Exercises: Supine   Bridge 5 reps     Lumbar Exercises: Quadruped   Single Arm Raise 10 reps  each arm, on green therapy ball   Straight Leg Raise 10 reps  each leg, on green therapy ball    Opposite Arm/Leg Raise Right arm/Left leg;Left arm/Right leg;5 reps  on green therapy ball     Knee/Hip Exercises: Stretches   Hip Flexor Stretch Left;2 reps;60 seconds   Other Knee/Hip Stretches sidelying Lt ITB stretch x 30 sec x 2 reps (trial of standing ITB leaning RT- unable to feel stretch)      Knee/Hip Exercises: Seated   Other Seated Knee/Hip Exercises sitting on green ball:  gentle bounces with legs in and out x 10 reps (core engaged)     Moist Heat Therapy   Number Minutes Moist Heat 15 Minutes   Moist Heat Location Lumbar Spine;Hip  ant thigh Lt     Electrical Stimulation   Electrical Stimulation Location bilat lumbar Lt hip laterally  and hip flexors    Electrical Stimulation Action premod to each area.    Electrical Stimulation Parameters to tolerance    Electrical Stimulation Goals Tone;Pain     Manual Therapy   Manual Therapy Myofascial release;Soft tissue mobilization   Manual therapy comments pt prone/supine   Soft tissue mobilization Bilat lumbar - Lt posterior hip and buttocks to IT band and lateral quad; quads   Myofascial Release lumbar spine; Lt lateral thigh            PT Long Term Goals - 01/29/16 1555      PT LONG TERM GOAL #3   Title Improve functional activity level with patient to tolerate 60 min of sitting; 15 min of standing and 10-15 min of walking 02/03/16   Time 8   Period Weeks   Status Partially Met  can only tolerate 25 min of sitting, but able to tolerate standing and walking      PT LONG TERM GOAL #4   Title Independent in advanced HEP 02/03/16   Time 8   Period Weeks   Status Partially Met     PT LONG TERM GOAL #5   Title Improve FOTO to </= 49% limitation 02/03/16   Time 8   Period Weeks   Status On-going               Plan - 01/29/16 1656    Clinical Impression Statement Pt interested in beginning a walking program outside of therapy; trial of treadmill to find out starting speed outside of therapy.  She tolerated prone/quadruped ext core exercises without increase in pain.  She was point tender with tissue tightness noted in Lt lateral quad and piriformis/glute.  Pt progressing towards goals.    PT Frequency 2x / week   PT Duration 8 weeks   PT Treatment/Interventions Patient/family education;Cryotherapy;Electrical Stimulation;Iontophoresis 28m/ml Dexamethasone;Moist Heat;Ultrasound;Neuromuscular re-education;Manual techniques;Dry needling;Therapeutic activities;Therapeutic exercise   PT Next Visit Plan manual work Lt LB and hip progress with strengthening with body weight resistance - moniter response to additional exercises - progress work on exercise ball which pt is  tolerating well.  Progress walking time on treadmill    Consulted and Agree with Plan of Care Patient      Patient will benefit from skilled therapeutic intervention in order to improve the following deficits and impairments:     Visit Diagnosis: Weakness generalized  Myalgia  Other abnormalities of gait and mobility  Other symptoms and signs involving the musculoskeletal system  Problem List Patient Active Problem List   Diagnosis Date Noted  . Pelvic relaxation 10/09/2014   Kerin Perna, PTA 01/29/16 5:08 PM  [place], [place], [address] Phone: [phone]   Fax:  [phone]  Name: Chelsea Fritz MRN: [id number] Date of Birth: 09/30/1970

## 2016-02-05 ENCOUNTER — Ambulatory Visit (INDEPENDENT_AMBULATORY_CARE_PROVIDER_SITE_OTHER): Payer: Worker's Compensation | Admitting: Physical Therapy

## 2016-02-05 DIAGNOSIS — R531 Weakness: Secondary | ICD-10-CM

## 2016-02-05 DIAGNOSIS — M791 Myalgia, unspecified site: Secondary | ICD-10-CM

## 2016-02-05 DIAGNOSIS — R2689 Other abnormalities of gait and mobility: Secondary | ICD-10-CM | POA: Diagnosis not present

## 2016-02-05 DIAGNOSIS — R29898 Other symptoms and signs involving the musculoskeletal system: Secondary | ICD-10-CM | POA: Diagnosis not present

## 2016-02-05 NOTE — Therapy (Signed)
[place], [place], [address] Phone: [phone]   Fax:  [phone]  Physical Therapy Treatment  Patient Details  Name: Chelsea Fritz MRN: [id number] Date of Birth: January 03, 1971 Referring Provider: Dr. Melina Schools   Encounter Date: 02/05/2016      PT End of Session - 02/05/16 1437    Visit Number 34   Number of Visits 38   Date for PT Re-Evaluation 03/04/16   PT Start Time 0539   PT Stop Time 1537   PT Time Calculation (min) 63 min   Activity Tolerance Patient tolerated treatment well      Past Medical History:  Diagnosis Date  . Anemia   . Chronic bronchitis ([place])   . Headache    otc med prn, sleep  . History of kidney stones    passed stone - no surgery required  . 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 Seasonal allergies   . SI (sacroiliac) pain    ablations done - SI joint - no anesthesia, darvocet only  . SVD (spontaneous vaginal delivery)    x 4    Past Surgical History:  Procedure Laterality Date  . ANTERIOR AND POSTERIOR REPAIR WITH SACROSPINOUS FIXATION N/A 10/09/2014   Procedure: ANTERIOR AND POSTERIOR REPAIR WITH SACROSPINOUS LIGAMENT SUSPENSION;  Surgeon: Everlene Farrier, MD;  Location: [facility];  Service: Gynecology;  Laterality: N/A;  . BILATERAL SALPINGECTOMY Bilateral 10/09/2014   Procedure: BILATERAL SALPINGECTOMY;  Surgeon: Everlene Farrier, MD;  Location: [facility];  Service: Gynecology;  Laterality: Bilateral;  . DILITATION & CURRETTAGE/HYSTROSCOPY WITH NOVASURE ABLATION    . KNEE SURGERY     x 2 - left  . LAPAROSCOPIC ASSISTED VAGINAL HYSTERECTOMY N/A 10/09/2014   Procedure: LAPAROSCOPIC ASSISTED VAGINAL HYSTERECTOMY;  Surgeon: Everlene Farrier, MD;  Location: [facility];  Service: Gynecology;  Laterality: N/A;  . NECK SURGERY     ? C4-C5  . ROTATOR CUFF REPAIR     left  . TUBAL LIGATION    . WISDOM TOOTH EXTRACTION      There were no vitals filed  for this visit.      Subjective Assessment - 02/05/16 1437    Subjective Pt reports she was very sore day after last visit. The doctor took her off of Meloxicam last week. She is taking tylenol / ibprofen with little relief.  She has noticed a big increase in pain every where in her body.    Pertinent History HNP L5/S1; injury to cervical spine with disc replacement ? level surgery 2/13; injury to Lt knee resolved without intervention; torn tendons in Lt ankle; ablasions in the Rt SI    Currently in Pain? Yes   Pain Score 4   has taken 6 ibprofen today.    Pain Location Back   Pain Orientation Left   Pain Descriptors / Indicators Aching;Dull   Aggravating Factors  sitting    Pain Relieving Factors water exercise, TENS, [place]            [facility] PT Assessment - 02/05/16 0001      Assessment   Medical Diagnosis SI dysfunction    Referring Provider Dr. Melina Schools    Onset Date/Surgical Date 07/15/10   Hand Dominance Left     Flexibility   Hamstrings LLE 92, RLE 98 deg   Quadriceps Lt 129 deg.           [facility] Adult PT Treatment/Exercise - 02/05/16 0001  Lumbar Exercises: Stretches   Passive Hamstring Stretch 3 reps;30 seconds   Standing Extension Limitations hip extension w/ knee flexed to ~90 deg for hip flexor stretch on Lt 30 sec x 3    Quad Stretch 2 reps;20 seconds     Lumbar Exercises: Aerobic   Tread Mill 6 min @ 1.8-2.3 mph     Moist Heat Therapy   Number Minutes Moist Heat 15 Minutes   Moist Heat Location Lumbar Spine;Hip     Electrical Stimulation   Electrical Stimulation Location bilat lumbar Lt hip laterally and hip flexors    Electrical Stimulation Action premod to each area   Electrical Stimulation Parameters to tolerance    Electrical Stimulation Goals Tone;Pain     Ultrasound   Ultrasound Location Lt lumbar/  Lt lateral distal thigh    Ultrasound Parameters 1.3 w/cm2, 100%, 10 min    Ultrasound Goals Pain  tightness      Manual Therapy    Manual Therapy Myofascial release;Soft tissue mobilization   Soft tissue mobilization Bilat lumbar - Lt posterior hip and buttocks to IT band and lateral quad; quads   Myofascial Release lumbar spine; Lt lateral thigh, lateral Lt hamstring                      PT Long Term Goals - 02/05/16 1654      PT LONG TERM GOAL #1   Title Improve trunk and LE stability allowing patient to increase functional level for standing and walking 02/03/16   Status Achieved     PT LONG TERM GOAL #2   Title Normal gait pattern with minimal to no pain over unlevel surfaces 03/04/16   Time 4   Period Weeks   Status Partially Met  on level surfaces she is good     PT LONG TERM GOAL #3   Title Improve functional activity level with patient to tolerate 60 min of sitting; 15 min of standing and 10-15 min of walking 02/03/16   Time 4   Period Weeks   Status Partially Met  25' of sitting, walking and standing > 15"     PT LONG TERM GOAL #4   Title Independent in advanced HEP 03/04/16   Time 4   Period Weeks   Status On-going     PT LONG TERM GOAL #5   Title Improve FOTO to </= 49% limitation 03/04/16   Time 4   Period Weeks   Status On-going  currently 50% limited, was 72% limited               Plan - 02/05/16 1655    Clinical Impression Statement Pt had increased pain since last visit.  Change in medication has played a roll in pain level.  Treatment kept gentle, attempting to calm hip/ low back.  She has partially met her goals and will benefit from continued therapy to maximize functional mobility.    Rehab Potential Good   PT Frequency 2x / week   PT Duration 8 weeks   PT Treatment/Interventions Patient/family education;Cryotherapy;Electrical Stimulation;Iontophoresis '4mg'$ /ml Dexamethasone;Moist Heat;Ultrasound;Neuromuscular re-education;Manual techniques;Dry needling;Therapeutic activities;Therapeutic exercise   PT Next Visit Plan TDN, gentle stretching, progress walking/  stabilization program.    Consulted and Agree with Plan of Care Patient      Patient will benefit from skilled therapeutic intervention in order to improve the following deficits and impairments:  Postural dysfunction, Improper body mechanics, Pain, Increased fascial restricitons, Increased muscle spasms, Abnormal gait, Decreased strength, Decreased  endurance, Decreased activity tolerance  Visit Diagnosis: Weakness generalized  Myalgia  Other abnormalities of gait and mobility  Other symptoms and signs involving the musculoskeletal system     Problem List Patient Active Problem List   Diagnosis Date Noted  . Pelvic relaxation 10/09/2014   Kerin Perna, PTA 02/05/16 5:02 PM  [place], [place], [address] Phone: [phone]   Fax:  [phone]  Name: Chelsea Fritz MRN: [id number] Date of Birth: October 07, 1970  Jeral Pinch, PT 02/05/16 5:06 PM

## 2016-02-10 ENCOUNTER — Ambulatory Visit (INDEPENDENT_AMBULATORY_CARE_PROVIDER_SITE_OTHER): Payer: BC Managed Care – PPO | Admitting: Physical Therapy

## 2016-02-10 DIAGNOSIS — M791 Myalgia, unspecified site: Secondary | ICD-10-CM

## 2016-02-10 DIAGNOSIS — R531 Weakness: Secondary | ICD-10-CM | POA: Diagnosis not present

## 2016-02-10 DIAGNOSIS — R2689 Other abnormalities of gait and mobility: Secondary | ICD-10-CM | POA: Diagnosis not present

## 2016-02-10 DIAGNOSIS — R29898 Other symptoms and signs involving the musculoskeletal system: Secondary | ICD-10-CM | POA: Diagnosis not present

## 2016-02-10 NOTE — Therapy (Addendum)
[facility]  [place] [place], [place], [address] Phone: [phone]   Fax:  [phone]  Physical Therapy Treatment  Patient Details  Name: Chelsea Fritz MRN: [id number] Date of Birth: 18-Nov-1970 Referring Provider: Dr. Melina Schools   Encounter Date: 02/10/2016      PT End of Session - 02/10/16 1519    Visit Number 35   Number of Visits 38   Date for PT Re-Evaluation 03/04/16   PT Start Time 1519   PT Stop Time 1627   PT Time Calculation (min) 68 min   Activity Tolerance Patient tolerated treatment well      Past Medical History:  Diagnosis Date  . Anemia   . Chronic bronchitis ([place])   . Headache    otc med prn, sleep  . History of kidney stones    passed stone - no surgery required  . 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 Seasonal allergies   . SI (sacroiliac) pain    ablations done - SI joint - no anesthesia, darvocet only  . SVD (spontaneous vaginal delivery)    x 4    Past Surgical History:  Procedure Laterality Date  . ANTERIOR AND POSTERIOR REPAIR WITH SACROSPINOUS FIXATION N/A 10/09/2014   Procedure: ANTERIOR AND POSTERIOR REPAIR WITH SACROSPINOUS LIGAMENT SUSPENSION;  Surgeon: Everlene Farrier, MD;  Location: [facility];  Service: Gynecology;  Laterality: N/A;  . BILATERAL SALPINGECTOMY Bilateral 10/09/2014   Procedure: BILATERAL SALPINGECTOMY;  Surgeon: Everlene Farrier, MD;  Location: [facility];  Service: Gynecology;  Laterality: Bilateral;  . DILITATION & CURRETTAGE/HYSTROSCOPY WITH NOVASURE ABLATION    . KNEE SURGERY     x 2 - left  . LAPAROSCOPIC ASSISTED VAGINAL HYSTERECTOMY N/A 10/09/2014   Procedure: LAPAROSCOPIC ASSISTED VAGINAL HYSTERECTOMY;  Surgeon: Everlene Farrier, MD;  Location: [facility];  Service: Gynecology;  Laterality: N/A;  . NECK SURGERY     ? C4-C5  . ROTATOR CUFF REPAIR     left  . TUBAL LIGATION    . WISDOM TOOTH EXTRACTION      There were no vitals filed  for this visit.      Subjective Assessment - 02/10/16 1519    Subjective She is ready for TDN, feels  alittle better since her last visit.    Currently in Pain? Yes   Pain Score 3    Pain Location Back   Pain Orientation Lower   Pain Descriptors / Indicators Aching   Pain Radiating Towards into Lt groin - aching to grabbing.    Pain Onset More than a month ago   Pain Frequency Constant   Aggravating Factors  using the nustep irritating to groin, sitting > 25'    Pain Relieving Factors TENs and heat.                          [facility] Adult PT Treatment/Exercise - 02/10/16 0001      Modalities   Modalities Electrical Stimulation;Moist Heat     Moist Heat Therapy   Number Minutes Moist Heat 20 Minutes   Moist Heat Location Lumbar Spine  inner thigh Lt     Electrical Stimulation   Electrical Stimulation Location Lt low back, buttock, inner thigh   Electrical Stimulation Action premod   Electrical Stimulation Parameters to tolerance   Electrical Stimulation Goals Tone;Pain     Manual Therapy   Manual Therapy Soft tissue mobilization   Soft tissue  mobilization Lt low back, buttocks and thigh          Trigger Point Dry Needling - 02/10/16 1657    Consent Given? Yes   Education Handout Provided No   Muscles Treated Lower Body Gluteus maximus;Piriformis;Adductor longus/brevius/maximus;Hamstring   Gluteus Maximus Response Twitch response elicited;Palpable increased muscle length   Piriformis Response Palpable increased muscle length;Twitch response elicited   Adductor Response Palpable increased muscle length;Twitch response elicited  with stim   Hamstring Response Palpable increased muscle length;Twitch response elicited  lateral Lt                    PT Long Term Goals - 02/05/16 1654      PT LONG TERM GOAL #1   Title Improve trunk and LE stability allowing patient to increase functional level for standing and walking 02/03/16   Status  Achieved     PT LONG TERM GOAL #2   Title Normal gait pattern with minimal to no pain over unlevel surfaces 03/04/16   Time 4   Period Weeks   Status Partially Met  on level surfaces she is good     PT LONG TERM GOAL #3   Title Improve functional activity level with patient to tolerate 60 min of sitting; 15 min of standing and 10-15 min of walking 02/03/16   Time 4   Period Weeks   Status Partially Met  25' of sitting, walking and standing > 15"     PT LONG TERM GOAL #4   Title Independent in advanced HEP 03/04/16   Time 4   Period Weeks   Status On-going     PT LONG TERM GOAL #5   Title Improve FOTO to </= 49% limitation 03/04/16   Time 4   Period Weeks   Status On-going  currently 50% limited, was 72% limited               Plan - 02/10/16 1611    Clinical Impression Statement Baxter Flattery had a lot of tightness in her Lt low back and thigh. She responded well to TND and manual work.  The muscle tightness, most likely from abnormal gait patterns, is her biggest limiting factor to returning to her PLOF. She has 3 more approved visits.    Rehab Potential Good   PT Frequency 2x / week   PT Duration 8 weeks   PT Treatment/Interventions Patient/family education;Cryotherapy;Electrical Stimulation;Iontophoresis 73m/ml Dexamethasone;Moist Heat;Ultrasound;Neuromuscular re-education;Manual techniques;Dry needling;Therapeutic activities;Therapeutic exercise   PT Next Visit Plan TDN, gentle stretching, progress walking/ stabilization program.    Consulted and Agree with Plan of Care Patient      Patient will benefit from skilled therapeutic intervention in order to improve the following deficits and impairments:  Postural dysfunction, Improper body mechanics, Pain, Increased fascial restricitons, Increased muscle spasms, Abnormal gait, Decreased strength, Decreased endurance, Decreased activity tolerance  Visit Diagnosis: Weakness generalized  Myalgia  Other abnormalities of gait and  mobility  Other symptoms and signs involving the musculoskeletal system     Problem List Patient Active Problem List   Diagnosis Date Noted  . Pelvic relaxation 10/09/2014    SJeral PinchPT 02/10/2016, 4:58 PM  [facility] [place] [address]Phone: [phone]  Fax:  [phone] Name: TStorm DulskiMRN: [id number] of Birth: 807-10-1970 PHYSICAL THERAPY DISCHARGE SUMMARY  Visits from Start of Care: 35  Current functional level related to goals / functional outcomes: unknown  Remaining deficits: Unknown at this time   Education / Equipment: HEP & TDN Plan:                                                    Patient goals were partially met. Patient is being discharged due to not returning since the last visit.  ?????     Jeral Pinch, PT 04/02/16 10:24 AM

## 2016-02-12 ENCOUNTER — Encounter: Payer: Self-pay | Admitting: Physical Therapy

## 2016-02-18 ENCOUNTER — Encounter: Payer: Self-pay | Admitting: Physical Therapy

## 2016-02-26 ENCOUNTER — Encounter: Payer: Self-pay | Admitting: Rehabilitative and Restorative Service Providers"

## 2017-01-14 ENCOUNTER — Other Ambulatory Visit: Payer: Self-pay | Admitting: Obstetrics and Gynecology

## 2017-01-14 DIAGNOSIS — N6310 Unspecified lump in the right breast, unspecified quadrant: Secondary | ICD-10-CM

## 2017-01-20 ENCOUNTER — Other Ambulatory Visit: Payer: Self-pay

## 2017-01-22 ENCOUNTER — Other Ambulatory Visit: Payer: Self-pay

## 2017-01-25 ENCOUNTER — Ambulatory Visit
Admission: RE | Admit: 2017-01-25 | Discharge: 2017-01-25 | Disposition: A | Discharge status: Home / Self Care | Payer: BC Managed Care – PPO | Source: Ambulatory Visit | Attending: Obstetrics and Gynecology | Admitting: Obstetrics and Gynecology

## 2017-01-25 DIAGNOSIS — N6310 Unspecified lump in the right breast, unspecified quadrant: Secondary | ICD-10-CM

## 2017-01-25 DIAGNOSIS — N63 Unspecified lump in unspecified breast: Secondary | ICD-10-CM

## 2017-01-25 HISTORY — DX: Unspecified lump in unspecified breast: N63.0

## 2017-02-02 ENCOUNTER — Other Ambulatory Visit (INDEPENDENT_AMBULATORY_CARE_PROVIDER_SITE_OTHER): Payer: Self-pay | Admitting: Physical Medicine and Rehabilitation

## 2017-02-02 ENCOUNTER — Telehealth (INDEPENDENT_AMBULATORY_CARE_PROVIDER_SITE_OTHER): Payer: Self-pay | Admitting: Physical Medicine and Rehabilitation

## 2017-02-02 MED ORDER — DIAZEPAM 5 MG PO TABS: ORAL_TABLET | ORAL | 0 refills | Status: DC

## 2017-02-02 NOTE — Telephone Encounter (Signed)
Printed prescription  

## 2017-02-02 NOTE — Telephone Encounter (Signed)
Faxed to patient's pharmacy.  

## 2017-02-22 ENCOUNTER — Ambulatory Visit (INDEPENDENT_AMBULATORY_CARE_PROVIDER_SITE_OTHER): Payer: Worker's Compensation | Admitting: Physical Medicine and Rehabilitation

## 2017-02-22 ENCOUNTER — Encounter (INDEPENDENT_AMBULATORY_CARE_PROVIDER_SITE_OTHER): Payer: Self-pay | Admitting: Physical Medicine and Rehabilitation

## 2017-02-22 ENCOUNTER — Ambulatory Visit (INDEPENDENT_AMBULATORY_CARE_PROVIDER_SITE_OTHER): Payer: Self-pay

## 2017-02-22 VITALS — BP 107/71 | HR 81 | Temp 98.5°F

## 2017-02-22 DIAGNOSIS — M461 Sacroiliitis, not elsewhere classified: Secondary | ICD-10-CM

## 2017-02-22 DIAGNOSIS — M792 Neuralgia and neuritis, unspecified: Secondary | ICD-10-CM | POA: Diagnosis not present

## 2017-02-22 MED ORDER — METHYLPREDNISOLONE ACETATE 80 MG/ML IJ SUSP
80.0000 mg | Freq: Once | INTRAMUSCULAR | Status: AC
  Administered 2017-02-22: 80 mg

## 2017-02-22 MED ORDER — LIDOCAINE HCL (PF) 1 % IJ SOLN
2.0000 mL | Freq: Once | INTRAMUSCULAR | Status: AC
  Administered 2017-02-22: 2 mL

## 2017-02-22 NOTE — Progress Notes (Deleted)
Here for RFA right side, pt is having some pain, pain scale 4/10. No allergy to contrast. Has driver, no blood thinners. Does get radiating pain at times mostly when walking. Taking meloxicam for pain.

## 2017-02-22 NOTE — Patient Instructions (Signed)

## 2017-03-02 NOTE — Progress Notes (Signed)
Chelsea Fritz - 46 y.o. female MRN [id number]  Date of birth: Feb 12, 1971  Office Visit Note: Visit Date: 02/22/2017 PCP: Deloris Ping, MD Referred by: Deloris Ping, *  Subjective: Chief Complaint  Patient presents with  . Lower Back - Pain   HPI: [name] is a very pleasant 46 year old female that we have known quite well over the last several years.  She is followed by Dr. Venita Lick a [facility] orthopedics.  We saw her at his request to complete radiofrequency ablation of the sacroiliac joints.  She had gotten relief with diagnostic injections through [facility] in their office.  Dr. Horald Chestnut does not perform the radiofrequency ablation procedure of the sacroiliac joints and so she was referred here.  She went on to have both joints was ablated with good success.  The right side would last almost 8 months while the left side very on the length of time that it lasted but it would go anywhere from 3 months to 6 months.  She ultimately had a fusion procedure of the left sacroiliac joint and this is doing well and she reports fairly good pain relief with that.  She really does not want to undergo the right as it was a signet downtime for recovery.  She has obtained authorization through her workers compensation for ablation of the right sacroiliac joint.  This would include ablation of the facet joint at L5-S1 as well as the dorsal rami of S1 and S2 to effectively denervate the posterior aspect of the right sacroiliac joint.  He has had no other issues she does get some radicular type pain but it seems to be related to the SI joint and this is all been looked at and determine before we saw her.  She has no focal weakness or paresthesias.  She has had no new issues.    Review of Systems  Constitutional: Negative for chills, fever, malaise/fatigue and weight loss.  HENT: Negative for hearing loss and sinus pain.   Eyes: Negative for blurred vision, double vision and photophobia.    Respiratory: Negative for cough and shortness of breath.   Cardiovascular: Negative for chest pain, palpitations and leg swelling.  Gastrointestinal: Negative for abdominal pain, nausea and vomiting.  Genitourinary: Negative for flank pain.  Musculoskeletal: Positive for back pain. Negative for myalgias.  Skin: Negative for itching and rash.  Neurological: Negative for tremors, focal weakness and weakness.  Endo/Heme/Allergies: Negative.   Psychiatric/Behavioral: Negative for depression.  All other systems reviewed and are negative.  Otherwise per HPI.  Assessment & Plan: Visit Diagnoses:  1. Sacroiliitis ([facility])   2. Neuritis     Plan: Findings:  Repeat sacroiliac joint denervation including denervation of the right L5-S1 facet joint as well as dorsal rami of S1 and S2.    Meds & Orders:  Meds ordered this encounter  Medications  . lidocaine (PF) (XYLOCAINE) 1 % injection 2 mL  . methylPREDNISolone acetate (DEPO-MEDROL) injection 80 mg    Orders Placed This Encounter  Procedures  . Radiofrequency,Lumbar  . XR C-ARM NO REPORT    Follow-up: Return if symptoms worsen or fail to improve, for Dr. Shon Baton.   Procedures: No procedures performed  Sacroiliac Joint Peripheral Nerve Denervation - Posterior Approach with Fluoroscopic Guidance   Patient: Chelsea Fritz      Date of Birth: [birth year] MRN: [id number] PCP: Deloris Ping, MD      Visit Date: 02/22/2017   Universal Protocol:    Date/Time: 12/04/185:55 AM  Consent Given By: the patient  Position: PRONE  Additional Comments: Vital signs were monitored before and after the procedure. Patient was prepped and draped in the usual sterile fashion. The correct patient, procedure, and site was verified.   Injection Procedure Details:  Procedure Site One Meds Administered:  Meds ordered this encounter  Medications  . lidocaine (PF) (XYLOCAINE) 1 % injection 2 mL  . methylPREDNISolone acetate (DEPO-MEDROL)  injection 80 mg     Laterality: Right  Location/Site:  L5-S1 S1-2 S2-3  Needle size: 18 G  Needle type: Stryker RF cannula  Needle Placement: as described below  Findings:   -Comments: Good stimulation was achieved around the L5-S1 facet joint.  There were no paresthesias at S1 or S2 to indicate any abnormal placement of the foramen.  Biplanar imaging was utilized to position the cannula appropriately.  Procedure Details: For the L5, S1 and S2 dorsal rami the fluoroscope was positioned to square off the sacrum or sacral ala to achieve a true AP midline view.  For the L5 dorsal rami the beam was then obliqued 15 to 20 degrees and caudally tilted 15 to 20 degrees to line up a trajectory along the target nerve. The skin over the target of the junction of superior articulating process and sacral ala was infiltrated with local anesthetic.  The 18 gauge 10mm active tip outer cannula was advanced in trajectory view to the target. The outer cannula placement was fine-tuned and the position was then confirmed with bi-planar imaging.  For the S1 and S2 dorsal rami, no obliquity was utilized but the same caudal tilt was used.  Three targets corresponding to the 12 o'clock and 6 o'clock and the lateral mid-line positions around the foramen were  visualized. The 18 gauge 10mm active tip outer cannula was advanced in trajectory view to each target. The outer cannula placement was fine-tuned and the position was then confirmed with bi-planar imaging  This procedure was repeated for each target nerve position.  Then, for all levels, the outer cannula placement was fine-tuned and the position was then confirmed with bi-planar imaging.    Test stimulation was done both at sensory and motor levels to ensure there was no radicular stimulation. The target tissues were then infiltrated with 1 ml of 1% Lidocaine without Epinephrine. Subsequently, a percutaneous neurotomy was carried out for 60 seconds at 80  degrees Celsius. The procedure was repeated with the cannula rotated 90 degrees, for duration of 60 seconds, one additional time at each level for a total of two lesions at the L5 dorsal rami.  One 60 second neurotomy was performed for each target around the S1 and S2 foramen. After the completion of the respective lesions, 1 ml of injectate was delivered.  Appropriate radiographs were obtained to verify the probe placement during the neurotomy.  Additional Comments:  The patient tolerated the procedure well No complications occurred Dressing: Band-Aid    Post-procedure details: Patient was observed during the procedure. Post-procedure instructions were reviewed.  Patient left the clinic in stable condition.     Clinical History: No specialty comments available.  She reports that  has never smoked. she has never used smokeless tobacco. No results for input(s): HGBA1C, LABURIC in the last 8760 hours.  Objective:  VS:  HT:    WT:   BMI:     BP:107/71  HR:81bpm  TEMP:98.5 F (36.9 C)(Oral)  RESP:98 % Physical Exam  Musculoskeletal:  The patient ambulates without aid with good distal strength.  She  has well healed posterior surgical scar over the left sacroiliac joint.  Positive Fortin finger sign on the right.  She does have some pain over the PSIS on the right.    Ortho Exam Imaging: No results found.  Past Medical/Family/Surgical/Social History: Medications & Allergies reviewed per EMR Patient Active Problem List   Diagnosis Date Noted  . 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 Anemia   . Breast mass 01/25/2017   Felt by Dr on 01/13/17  . Chronic bronchitis ([facility])   . Headache    otc med prn, sleep  . History of kidney stones    passed stone - no surgery required  . 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 Seasonal allergies   . SI (sacroiliac) pain    ablations done - SI joint - no anesthesia, darvocet only    . SVD (spontaneous vaginal delivery)    x 4   Family History  Problem Relation Age of Onset  . Breast cancer Paternal Aunt   . Breast cancer Cousin   . Breast cancer Cousin    Past Surgical History:  Procedure Laterality Date  . ANTERIOR AND POSTERIOR REPAIR WITH SACROSPINOUS FIXATION N/A 10/09/2014   Procedure: ANTERIOR AND POSTERIOR REPAIR WITH SACROSPINOUS LIGAMENT SUSPENSION;  Surgeon: Harold Hedge, MD;  Location: [facility];  Service: Gynecology;  Laterality: N/A;  . BILATERAL SALPINGECTOMY Bilateral 10/09/2014   Procedure: BILATERAL SALPINGECTOMY;  Surgeon: Harold Hedge, MD;  Location: [facility];  Service: Gynecology;  Laterality: Bilateral;  . DILITATION & CURRETTAGE/HYSTROSCOPY WITH NOVASURE ABLATION    . KNEE SURGERY     x 2 - left  . LAPAROSCOPIC ASSISTED VAGINAL HYSTERECTOMY N/A 10/09/2014   Procedure: LAPAROSCOPIC ASSISTED VAGINAL HYSTERECTOMY;  Surgeon: Harold Hedge, MD;  Location: [facility];  Service: Gynecology;  Laterality: N/A;  . NECK SURGERY     ? C4-C5  . ROTATOR CUFF REPAIR     left  . TUBAL LIGATION    . WISDOM TOOTH EXTRACTION     Social History   Occupational History  . Not on file  Tobacco Use  . Smoking status: Never Smoker  . Smokeless tobacco: Never Used  Substance and Sexual Activity  . Alcohol use: Yes    Comment: social  . Drug use: No  . Sexual activity: Yes    Birth control/protection: None    Comment: ablation

## 2017-03-02 NOTE — Procedures (Signed)
Sacroiliac Joint Peripheral Nerve Denervation - Posterior Approach with Fluoroscopic Guidance   Patient: Chelsea Fritz      Date of Birth: 08-Sep-1970 MRN: [id number] PCP: Deloris Pingyter-Brown, Sherry M, MD      Visit Date: 02/22/2017   Universal Protocol:    Date/Time: 12/04/185:55 AM  Consent Given By: the patient  Position: PRONE  Additional Comments: Vital signs were monitored before and after the procedure. Patient was prepped and draped in the usual sterile fashion. The correct patient, procedure, and site was verified.   Injection Procedure Details:  Procedure Site One Meds Administered:  Meds ordered this encounter  Medications  . lidocaine (PF) (XYLOCAINE) 1 % injection 2 mL  . methylPREDNISolone acetate (DEPO-MEDROL) injection 80 mg     Laterality: Right  Location/Site:  L5-S1 S1-2 S2-3  Needle size: 18 G  Needle type: Stryker RF cannula  Needle Placement: as described below  Findings:   -Comments: Good stimulation was achieved around the L5-S1 facet joint.  There were no paresthesias at S1 or S2 to indicate any abnormal placement of the foramen.  Biplanar imaging was utilized to position the cannula appropriately.  Procedure Details: For the L5, S1 and S2 dorsal rami the fluoroscope was positioned to square off the sacrum or sacral ala to achieve a true AP midline view.  For the L5 dorsal rami the beam was then obliqued 15 to 20 degrees and caudally tilted 15 to 20 degrees to line up a trajectory along the target nerve. The skin over the target of the junction of superior articulating process and sacral ala was infiltrated with local anesthetic.  The 18 gauge 10mm active tip outer cannula was advanced in trajectory view to the target. The outer cannula placement was fine-tuned and the position was then confirmed with bi-planar imaging.  For the S1 and S2 dorsal rami, no obliquity was utilized but the same caudal tilt was used.  Three targets corresponding to the 12  o'clock and 6 o'clock and the lateral mid-line positions around the foramen were  visualized. The 18 gauge 10mm active tip outer cannula was advanced in trajectory view to each target. The outer cannula placement was fine-tuned and the position was then confirmed with bi-planar imaging  This procedure was repeated for each target nerve position.  Then, for all levels, the outer cannula placement was fine-tuned and the position was then confirmed with bi-planar imaging.    Test stimulation was done both at sensory and motor levels to ensure there was no radicular stimulation. The target tissues were then infiltrated with 1 ml of 1% Lidocaine without Epinephrine. Subsequently, a percutaneous neurotomy was carried out for 60 seconds at 80 degrees Celsius. The procedure was repeated with the cannula rotated 90 degrees, for duration of 60 seconds, one additional time at each level for a total of two lesions at the L5 dorsal rami.  One 60 second neurotomy was performed for each target around the S1 and S2 foramen. After the completion of the respective lesions, 1 ml of injectate was delivered.  Appropriate radiographs were obtained to verify the probe placement during the neurotomy.  Additional Comments:  The patient tolerated the procedure well No complications occurred Dressing: Band-Aid    Post-procedure details: Patient was observed during the procedure. Post-procedure instructions were reviewed.  Patient left the clinic in stable condition.

## 2017-09-10 ENCOUNTER — Telehealth (INDEPENDENT_AMBULATORY_CARE_PROVIDER_SITE_OTHER): Payer: Self-pay

## 2017-09-10 NOTE — Telephone Encounter (Signed)
Pt is scheduled for 7/25 with driver.

## 2017-09-10 NOTE — Telephone Encounter (Signed)
I received email from pts wc adjustor Natale LayKelly Langille stating pt is authorized to have another RFA. Pt left a message earlier wanting to schedule on a Thursday if possible.

## 2017-10-20 ENCOUNTER — Telehealth (INDEPENDENT_AMBULATORY_CARE_PROVIDER_SITE_OTHER): Payer: Self-pay | Admitting: Physical Medicine and Rehabilitation

## 2017-10-21 ENCOUNTER — Ambulatory Visit (INDEPENDENT_AMBULATORY_CARE_PROVIDER_SITE_OTHER): Payer: Worker's Compensation | Admitting: Physical Medicine and Rehabilitation

## 2017-10-21 ENCOUNTER — Ambulatory Visit (INDEPENDENT_AMBULATORY_CARE_PROVIDER_SITE_OTHER): Payer: Self-pay

## 2017-10-21 ENCOUNTER — Encounter (INDEPENDENT_AMBULATORY_CARE_PROVIDER_SITE_OTHER): Payer: Self-pay | Admitting: Physical Medicine and Rehabilitation

## 2017-10-21 ENCOUNTER — Other Ambulatory Visit (INDEPENDENT_AMBULATORY_CARE_PROVIDER_SITE_OTHER): Payer: Self-pay | Admitting: Physical Medicine and Rehabilitation

## 2017-10-21 VITALS — BP 106/68 | HR 89

## 2017-10-21 DIAGNOSIS — M461 Sacroiliitis, not elsewhere classified: Secondary | ICD-10-CM | POA: Diagnosis not present

## 2017-10-21 MED ORDER — METHYLPREDNISOLONE ACETATE 80 MG/ML IJ SUSP
80.0000 mg | Freq: Once | INTRAMUSCULAR | Status: AC
  Administered 2017-10-21: 80 mg

## 2017-10-21 MED ORDER — DIAZEPAM 5 MG PO TABS: ORAL_TABLET | ORAL | 0 refills | Status: DC

## 2017-10-21 NOTE — Patient Instructions (Signed)

## 2017-10-21 NOTE — Progress Notes (Signed)
 .  Numeric Pain Rating Scale and Functional Assessment Average Pain 5   In the last MONTH (on 0-10 scale) has pain interfered with the following?  1. General activity like being  able to carry out your everyday physical activities such as walking, climbing stairs, carrying groceries, or moving a chair?  Rating(4)   +Driver, -BT, -Dye Allergies.  

## 2017-10-26 NOTE — Progress Notes (Signed)
Chelsea Fritz - 47 y.o. female MRN [id number]  Date of birth: 08-Dec-1970  Office Visit Note: Visit Date: 10/21/2017 PCP: Deloris Ping, MD Referred by: Deloris Ping, *  Subjective: Chief Complaint  Patient presents with  . Lower Back - Pain  . 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 Venita Lick at [facility] orthopedics who she continues to see.  She went on to have left-sided sacroiliac joint fusion.  She continues to have right lower back and buttock pain with referral into the leg consistent with a sacroiliac joint mediated pain.  Prior ablation procedure do give her quite a bit of relief for many months.  These notes can be reviewed as well as Dr. Shon Baton notes.  She comes in today for repeat ablation of the L5-S1 facet joint and sacroiliac joint dorsal rami or lateral branch nerves.   ROS Otherwise per HPI.  Assessment & Plan: Visit Diagnoses:  1. Sacroiliitis (HCC)     Plan: No additional findings.   Meds & Orders:  Meds ordered this encounter  Medications  . methylPREDNISolone acetate (DEPO-MEDROL) injection 80 mg    Orders Placed This Encounter  Procedures  . Radiofrequency,Lumbar  . XR C-ARM NO REPORT    Follow-up: Return if symptoms worsen or fail to improve.   Procedures: No procedures performed  Sacroiliac Joint Peripheral Nerve Denervation - Posterior Approach with Fluoroscopic Guidance   Patient: Chelsea Fritz      Date of Birth: [birth year] MRN: [id number] PCP: Deloris Ping, MD      Visit Date: 10/21/2017   Universal Protocol:    Date/Time: 07/30/196:32 AM  Consent Given By: the patient  Position: PRONE  Additional Comments: Vital signs were monitored before and after the procedure. Patient  was prepped and draped in the usual sterile fashion. The correct patient, procedure, and site was verified.   Injection Procedure Details:  Procedure Site One Meds Administered:  Meds ordered this encounter  Medications  . methylPREDNISolone acetate (DEPO-MEDROL) injection 80 mg     Laterality: Right  Location/Site:  L5-S1 S1-2 S2-3  Needle size: 18 G  Needle type: Stryker radiofrequency ablation cannula    Findings:    -Comments: Good stimulation was achieved at the L5-S1 facet joint denervation went appropriately.  Good position around the S1 and S2 foramen was obtained and images were taken appropriately and radiofrequency ablation was performed without difficulty.  Procedure Details: For the L5, S1 and S2 dorsal rami the fluoroscope was positioned to square off the sacrum or sacral ala to achieve a true AP midline view.  For the L5 dorsal rami the beam was then obliqued 15 to 20 degrees and caudally tilted 15 to 20 degrees to line up a trajectory along the target nerve. The skin over the target of the junction of superior articulating process and sacral ala was infiltrated with local anesthetic.  The 18 gauge 10mm active tip outer cannula was advanced in trajectory view to the target. The outer cannula placement was fine-tuned and the position was then confirmed with bi-planar imaging.  For the S1 and S2 dorsal rami, no obliquity was utilized but the same caudal tilt was used.  Three targets corresponding to the 12 o'clock and 6 o'clock and the  lateral mid-line positions around the foramen were  visualized. The 18 gauge 10mm active tip outer cannula was advanced in trajectory view to each target. The outer cannula placement was fine-tuned and the position was then confirmed with bi-planar imaging  This procedure was repeated for each target nerve position.  Then, for all levels, the outer cannula placement was fine-tuned and the position was then confirmed with bi-planar  imaging.    Test stimulation was done both at sensory and motor levels to ensure there was no radicular stimulation. The target tissues were then infiltrated with 1 ml of 1% Lidocaine without Epinephrine. Subsequently, a percutaneous neurotomy was carried out for 60 seconds at 80 degrees Celsius. The procedure was repeated with the cannula rotated 90 degrees, for duration of 60 seconds, one additional time at each level for a total of two lesions at the L5 dorsal rami.  One 60 second neurotomy was performed for each target around the S1 and S2 foramen. After the completion of the respective lesions, 1 ml of injectate was delivered.  Appropriate radiographs were obtained to verify the probe placement during the neurotomy.  Additional Comments:  The patient tolerated the procedure well Dressing: Band-Aid    Post-procedure details: Patient was observed during the procedure. Post-procedure instructions were reviewed.  Patient left the clinic in stable condition.     Clinical History: No specialty comments available.   She reports that she has never smoked. She has never used smokeless tobacco. No results for input(s): HGBA1C, LABURIC in the last 8760 hours.  Objective:  VS:  HT:    WT:   BMI:     BP:106/68  HR:89bpm  TEMP: ( )  RESP:  Physical Exam  Ortho Exam Imaging: No results found.  Past Medical/Family/Surgical/Social History: Medications & Allergies reviewed per EMR, new medications updated. Patient Active Problem List   Diagnosis Date Noted  . 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 Anemia   . Breast mass 01/25/2017   Felt by Dr on 01/13/17  . Chronic bronchitis (HCC)   . Headache    otc med prn, sleep  . History of kidney stones    passed stone - no surgery required  . 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 Seasonal allergies   . SI (sacroiliac) pain    ablations done - SI joint - no anesthesia, darvocet  only  . SVD (spontaneous vaginal delivery)    x 4   Family History  Problem Relation Age of Onset  . Breast cancer Paternal Aunt   . Breast cancer Cousin   . Breast cancer Cousin    Past Surgical History:  Procedure Laterality Date  . ANTERIOR AND POSTERIOR REPAIR WITH SACROSPINOUS FIXATION N/A 10/09/2014   Procedure: ANTERIOR AND POSTERIOR REPAIR WITH SACROSPINOUS LIGAMENT SUSPENSION;  Surgeon: Harold HedgeJames Tomblin, MD;  Location: [facility];  Service: Gynecology;  Laterality: N/A;  . BILATERAL SALPINGECTOMY Bilateral 10/09/2014   Procedure: BILATERAL SALPINGECTOMY;  Surgeon: Harold HedgeJames Tomblin, MD;  Location: [facility];  Service: Gynecology;  Laterality: Bilateral;  . DILITATION & CURRETTAGE/HYSTROSCOPY WITH NOVASURE ABLATION    . KNEE SURGERY     x 2 - left  . LAPAROSCOPIC ASSISTED VAGINAL HYSTERECTOMY N/A 10/09/2014   Procedure: LAPAROSCOPIC ASSISTED VAGINAL HYSTERECTOMY;  Surgeon: Harold HedgeJames Tomblin, MD;  Location: [facility];  Service: Gynecology;  Laterality: N/A;  . NECK SURGERY     ? C4-C5  . ROTATOR CUFF REPAIR  left  . TUBAL LIGATION    . WISDOM TOOTH EXTRACTION     Social History   Occupational History  . Not on file  Tobacco Use  . Smoking status: Never Smoker  . Smokeless tobacco: Never Used  Substance and Sexual Activity  . Alcohol use: Yes    Comment: social  . Drug use: No  . Sexual activity: Yes    Birth control/protection: None    Comment: ablation

## 2017-10-26 NOTE — Procedures (Signed)
Sacroiliac Joint Peripheral Nerve Denervation - Posterior Approach with Fluoroscopic Guidance   Patient: Chelsea Fritz      Date of Birth: Sep 17, 1970 MRN: [id number] PCP: Deloris Pingyter-Brown, Sherry M, MD      Visit Date: 10/21/2017   Universal Protocol:    Date/Time: 07/30/196:32 AM  Consent Given By: the patient  Position: PRONE  Additional Comments: Vital signs were monitored before and after the procedure. Patient was prepped and draped in the usual sterile fashion. The correct patient, procedure, and site was verified.   Injection Procedure Details:  Procedure Site One Meds Administered:  Meds ordered this encounter  Medications  . methylPREDNISolone acetate (DEPO-MEDROL) injection 80 mg     Laterality: Right  Location/Site:  L5-S1 S1-2 S2-3  Needle size: 18 G  Needle type: Stryker radiofrequency ablation cannula    Findings:    -Comments: Good stimulation was achieved at the L5-S1 facet joint denervation went appropriately.  Good position around the S1 and S2 foramen was obtained and images were taken appropriately and radiofrequency ablation was performed without difficulty.  Procedure Details: For the L5, S1 and S2 dorsal rami the fluoroscope was positioned to square off the sacrum or sacral ala to achieve a true AP midline view.  For the L5 dorsal rami the beam was then obliqued 15 to 20 degrees and caudally tilted 15 to 20 degrees to line up a trajectory along the target nerve. The skin over the target of the junction of superior articulating process and sacral ala was infiltrated with local anesthetic.  The 18 gauge 10mm active tip outer cannula was advanced in trajectory view to the target. The outer cannula placement was fine-tuned and the position was then confirmed with bi-planar imaging.  For the S1 and S2 dorsal rami, no obliquity was utilized but the same caudal tilt was used.  Three targets corresponding to the 12 o'clock and 6 o'clock and the lateral  mid-line positions around the foramen were  visualized. The 18 gauge 10mm active tip outer cannula was advanced in trajectory view to each target. The outer cannula placement was fine-tuned and the position was then confirmed with bi-planar imaging  This procedure was repeated for each target nerve position.  Then, for all levels, the outer cannula placement was fine-tuned and the position was then confirmed with bi-planar imaging.    Test stimulation was done both at sensory and motor levels to ensure there was no radicular stimulation. The target tissues were then infiltrated with 1 ml of 1% Lidocaine without Epinephrine. Subsequently, a percutaneous neurotomy was carried out for 60 seconds at 80 degrees Celsius. The procedure was repeated with the cannula rotated 90 degrees, for duration of 60 seconds, one additional time at each level for a total of two lesions at the L5 dorsal rami.  One 60 second neurotomy was performed for each target around the S1 and S2 foramen. After the completion of the respective lesions, 1 ml of injectate was delivered.  Appropriate radiographs were obtained to verify the probe placement during the neurotomy.  Additional Comments:  The patient tolerated the procedure well Dressing: Band-Aid    Post-procedure details: Patient was observed during the procedure. Post-procedure instructions were reviewed.  Patient left the clinic in stable condition.

## 2017-10-27 NOTE — Telephone Encounter (Signed)
Prescription sent. Closing encounter.

## 2018-04-25 ENCOUNTER — Other Ambulatory Visit: Payer: Self-pay | Admitting: Obstetrics and Gynecology

## 2018-04-25 DIAGNOSIS — N6310 Unspecified lump in the right breast, unspecified quadrant: Secondary | ICD-10-CM

## 2018-04-28 ENCOUNTER — Other Ambulatory Visit: Payer: BC Managed Care – PPO

## 2018-08-18 ENCOUNTER — Telehealth: Payer: Self-pay | Admitting: Physical Medicine and Rehabilitation

## 2018-08-18 NOTE — Telephone Encounter (Signed)
Does this need to be approved by wc adjuster?

## 2018-08-18 NOTE — Telephone Encounter (Signed)
Emailed Jeb Levering with [facility] to see if repeat injection is approved by WC ([email or web address])

## 2018-08-18 NOTE — Telephone Encounter (Signed)
Yes If approved

## 2018-09-07 NOTE — Telephone Encounter (Signed)
Patient is scheduled for 7/9. She would like valium prior to procedure. Pharmacy is correct.

## 2018-09-07 NOTE — Telephone Encounter (Signed)
Ok to schedule pt. Just received the following email from the wc adj:  Pennix, [email or web address]> Wed 09/07/2018 9:28 AM *Caution - External email - see footer for warnings*  Good morning,       I hope that all is well. The above workers compensation patient is authorized for nerve ablation as recommended by Dr. Ernestina Patches, thank you.

## 2018-09-07 NOTE — Telephone Encounter (Signed)
Still waiting on auth from wc adj. Wilber Bihari with pts atty emailed the adj again ([email or web address]) and cc'd me on the email.   Good morning Chelsea Fritz,  I am following up in regards to authorization for Ms. Chelsea Fritz.  She is really needing to get in to see Dr. Ernestina Patches as soon as possible.  Could you please have the adjuster forward the authorization today so [name] can get scheduled for the next available appointment?  Thank you,     Marshall Cork. Boyce Medici  [id number]] [place], [place] [address] Direct Line:  [phone] Direct Fax:   [phone], extension [phone] toll free:  [phone] fax: [phone] [email or web address]

## 2018-09-08 ENCOUNTER — Other Ambulatory Visit: Payer: Self-pay | Admitting: Physical Medicine and Rehabilitation

## 2018-09-08 DIAGNOSIS — F411 Generalized anxiety disorder: Secondary | ICD-10-CM

## 2018-09-08 MED ORDER — DIAZEPAM 5 MG PO TABS: ORAL_TABLET | ORAL | 0 refills | Status: DC

## 2018-09-08 NOTE — Progress Notes (Signed)
Pre-procedure diazepam ordered for pre-operative anxiety.  

## 2018-09-08 NOTE — Telephone Encounter (Signed)
done

## 2018-10-06 ENCOUNTER — Ambulatory Visit: Payer: Self-pay

## 2018-10-06 ENCOUNTER — Other Ambulatory Visit: Payer: Self-pay

## 2018-10-06 ENCOUNTER — Encounter: Payer: Self-pay | Admitting: Physical Medicine and Rehabilitation

## 2018-10-06 ENCOUNTER — Ambulatory Visit (INDEPENDENT_AMBULATORY_CARE_PROVIDER_SITE_OTHER): Payer: BC Managed Care – PPO | Admitting: Physical Medicine and Rehabilitation

## 2018-10-06 VITALS — BP 106/72 | HR 77

## 2018-10-06 DIAGNOSIS — M461 Sacroiliitis, not elsewhere classified: Secondary | ICD-10-CM

## 2018-10-06 DIAGNOSIS — M792 Neuralgia and neuritis, unspecified: Secondary | ICD-10-CM

## 2018-10-06 MED ORDER — METHYLPREDNISOLONE ACETATE 80 MG/ML IJ SUSP
80.0000 mg | Freq: Once | INTRAMUSCULAR | Status: AC
  Administered 2018-10-06: 17:00:00 80 mg

## 2018-10-06 NOTE — Progress Notes (Signed)
  Numeric Pain Rating Scale and Functional Assessment Average Pain 5   In the last MONTH (on 0-10 scale) has pain interfered with the following?  1. General activity like being  able to carry out your everyday physical activities such as walking, climbing stairs, carrying groceries, or moving a chair?  Rating(7)   +Driver, -BT, -Dye Allergies. 

## 2018-10-10 NOTE — Progress Notes (Signed)
Chelsea Risingara Roznowski - 48 y.o. female MRN [id number]  Date of birth: 09/20/1970  Office Visit Note: Visit Date: 10/06/2018 PCP: Deloris Pingyter-Brown, Sherry M, MD Referred by: Deloris Pingyter-Brown, Sherry M, *  Subjective: Chief Complaint  Patient presents with  . Lower Back - Pain   HPI:  Chelsea Fritz is a 48 y.o. female who comes in today For repeat right-sided sacroiliac joint radiofrequency ablation/denervation.  This is basically denervating the L5-S1 facet joint on the right as well as the lateral branches of the S1 and S2 dorsal roots.  She is done well in the past with these and the results are well documented in our notes and through Dr. Shon BatonBrooks.  She was originally sent to me by Dr. Shon BatonBrooks and Dr. Ethelene Halamos from [facility].  She has gone on to have left-sided fusion of the sacroiliac joint with good resolution of symptoms.  She feels like the right side is not nearly as bad as the left was and she is getting good relief for several months out of the year with the ablation.  Last procedure was performed approximately 1 year ago on October 21, 2017.  She has had no new issues or trauma or change in symptoms.  ROS Otherwise per HPI.  Assessment & Plan: Visit Diagnoses:  1. Sacroiliitis (HCC)   2. Neuritis     Plan: No additional findings.   Meds & Orders:  Meds ordered this encounter  Medications  . methylPREDNISolone acetate (DEPO-MEDROL) injection 80 mg    Orders Placed This Encounter  Procedures  . Radiofrequency,Lumbar  . XR C-ARM NO REPORT    Follow-up: No follow-ups on file.   Procedures: No procedures performed  Sacroiliac Joint Peripheral Nerve Denervation - Posterior Approach with Fluoroscopic Guidance   Patient: Chelsea Fritz      Date of Birth: 09/20/1970 MRN: [id number] PCP: Deloris Pingyter-Brown, Sherry M, MD      Visit Date: 10/06/2018   Universal Protocol:    Date/Time: 07/13/206:16 AM  Consent Given By: the patient  Position: PRONE  Additional Comments: Vital signs were monitored  before and after the procedure. Patient was prepped and draped in the usual sterile fashion. The correct patient, procedure, and site was verified.   Injection Procedure Details:  Procedure Site One Meds Administered:  Meds ordered this encounter  Medications  . methylPREDNISolone acetate (DEPO-MEDROL) injection 80 mg     Laterality: Right  Location/Site:  L5-S1  Needle size: 18 G  Needle type: [employer] radiofrequency cannula  Findings:    -Comments: Biplanar fluoroscopic imaging was asked to position the cannula as described below and there was good stimulation noted.   Procedure Details: For the L5, S1 and S2 dorsal rami the fluoroscope was positioned to square off the sacrum or sacral ala to achieve a true AP midline view.  For the L5 dorsal rami (L5-S1 facet joint) the beam was then obliqued 15 to 20 degrees and caudally tilted 15 to 20 degrees to line up a trajectory along the target nerve. The skin over the target of the junction of superior articulating process and sacral ala was infiltrated with local anesthetic.  The 18 gauge 10mm active tip outer cannula was advanced in trajectory view to the target. The outer cannula placement was fine-tuned and the position was then confirmed with bi-planar imaging.  For the S1 and S2 dorsal rami, no obliquity was utilized but the same caudal tilt was used.  Three targets corresponding to the 12 o'clock and 6 o'clock and the lateral mid-line  positions around the foramen were  visualized. The 18 gauge 88mm active tip outer cannula was advanced in trajectory view to each target. The outer cannula placement was fine-tuned and the position was then confirmed with bi-planar imaging  This procedure was repeated for each target nerve position.  Then, for all levels, the outer cannula placement was fine-tuned and the position was then confirmed with bi-planar imaging.    Test stimulation was done both at sensory and motor levels to  ensure there was no radicular stimulation. The target tissues were then infiltrated with 1 ml of 1% Lidocaine without Epinephrine. Subsequently, a percutaneous neurotomy was carried out for 90 seconds at 80 degrees Celsius. The procedure was repeated with the cannula rotated 90 degrees, for duration of 60 seconds, one additional time at each level for a total of two lesions at the L5 dorsal rami.  One 90 second neurotomy was performed for each target around the S1 and S2 foramen. After the completion of the respective lesions, 1 ml of injectate was delivered.  Appropriate radiographs were obtained to verify the probe placement during the neurotomy.  Additional Comments:  The patient tolerated the procedure well Dressing: Band-Aid    Post-procedure details: Patient was observed during the procedure. Post-procedure instructions were review   Patient left the clinic in stable condition.     Clinical History: No specialty comments available.     Objective:  VS:  HT:    WT:   BMI:     BP:106/72  HR:77bpm  TEMP: ( )  RESP:100 % Physical Exam Musculoskeletal:     Right lower leg: No edema.     Left lower leg: No edema.     Comments: Patient ambulates without aid with good distal strength.  She has a positive Fortin finger sign on the right.  Pain with Patrick's.  Neurological:     General: No focal deficit present.     Mental Status: She is oriented to person, place, and time.     Sensory: No sensory deficit.     Coordination: Coordination normal.     Gait: Gait normal.  Psychiatric:        Mood and Affect: Mood normal.        Behavior: Behavior normal.     Ortho Exam Imaging: No results found.

## 2018-10-10 NOTE — Procedures (Signed)
Sacroiliac Joint Peripheral Nerve Denervation - Posterior Approach with Fluoroscopic Guidance   Patient: Chelsea Fritz      Date of Birth: Dec 14, 1970 MRN: [id number] PCP: Wayland Salinas, MD      Visit Date: 10/06/2018   Universal Protocol:    Date/Time: 07/13/206:16 AM  Consent Given By: the patient  Position: PRONE  Additional Comments: Vital signs were monitored before and after the procedure. Patient was prepped and draped in the usual sterile fashion. The correct patient, procedure, and site was verified.   Injection Procedure Details:  Procedure Site One Meds Administered:  Meds ordered this encounter  Medications  . methylPREDNISolone acetate (DEPO-MEDROL) injection 80 mg     Laterality: Right  Location/Site:  L5-S1  Needle size: 18 G  Needle type: Physicist, medical cannula  Findings:    -Comments: Biplanar fluoroscopic imaging was asked to position the cannula as described below and there was good stimulation noted.   Procedure Details: For the L5, S1 and S2 dorsal rami the fluoroscope was positioned to square off the sacrum or sacral ala to achieve a true AP midline view.  For the L5 dorsal rami (L5-S1 facet joint) the beam was then obliqued 15 to 20 degrees and caudally tilted 15 to 20 degrees to line up a trajectory along the target nerve. The skin over the target of the junction of superior articulating process and sacral ala was infiltrated with local anesthetic.  The 18 gauge 45mm active tip outer cannula was advanced in trajectory view to the target. The outer cannula placement was fine-tuned and the position was then confirmed with bi-planar imaging.  For the S1 and S2 dorsal rami, no obliquity was utilized but the same caudal tilt was used.  Three targets corresponding to the 12 o'clock and 6 o'clock and the lateral mid-line positions around the foramen were  visualized. The 18 gauge 13mm active tip outer cannula was advanced in  trajectory view to each target. The outer cannula placement was fine-tuned and the position was then confirmed with bi-planar imaging  This procedure was repeated for each target nerve position.  Then, for all levels, the outer cannula placement was fine-tuned and the position was then confirmed with bi-planar imaging.    Test stimulation was done both at sensory and motor levels to ensure there was no radicular stimulation. The target tissues were then infiltrated with 1 ml of 1% Lidocaine without Epinephrine. Subsequently, a percutaneous neurotomy was carried out for 90 seconds at 80 degrees Celsius. The procedure was repeated with the cannula rotated 90 degrees, for duration of 60 seconds, one additional time at each level for a total of two lesions at the L5 dorsal rami.  One 90 second neurotomy was performed for each target around the S1 and S2 foramen. After the completion of the respective lesions, 1 ml of injectate was delivered.  Appropriate radiographs were obtained to verify the probe placement during the neurotomy.  Additional Comments:  The patient tolerated the procedure well Dressing: Band-Aid    Post-procedure details: Patient was observed during the procedure. Post-procedure instructions were review   Patient left the clinic in stable condition.

## 2018-11-14 ENCOUNTER — Other Ambulatory Visit: Payer: Self-pay

## 2018-11-14 ENCOUNTER — Ambulatory Visit
Admission: RE | Admit: 2018-11-14 | Discharge: 2018-11-14 | Disposition: A | Discharge status: Home / Self Care | Payer: BC Managed Care – PPO | Source: Ambulatory Visit | Attending: Obstetrics and Gynecology | Admitting: Obstetrics and Gynecology

## 2018-11-14 DIAGNOSIS — N6310 Unspecified lump in the right breast, unspecified quadrant: Secondary | ICD-10-CM

## 2019-05-01 ENCOUNTER — Telehealth: Payer: Self-pay

## 2019-05-01 NOTE — Telephone Encounter (Signed)
Please advise on scheduling

## 2019-05-01 NOTE — Telephone Encounter (Signed)
Looks to me like this is for a follow up evaluation not repeat of ablation?

## 2019-05-01 NOTE — Telephone Encounter (Signed)
Received the following e-mail from pts wc adj:   Please be advised that I am the adjuster now assigned to the above captioned patient for her WC claim of 08/29/10.   I have been advised that Ms. Krolikowski wishes to return to Dr. Alvester Morin for an evaluation due to ongoing issues concerning her WC claim.  Please let this serve as authorization for your office to schedule a f/u appointment with Dr. Alvester Morin for Ms. Sanitago.  Please notify all on this e-mail once scheduled for the date/time of the appointment.  If you require any additional information, please feel free to let  me know.  Look forward to hearing your response concerning the appt.           [address] [place], [place] DIRECT [phone]  Fax:  [phone] [email or web address]  Caring counts

## 2019-05-02 NOTE — Telephone Encounter (Signed)
Scheduled for OV 2/3 at 0900.

## 2019-05-03 ENCOUNTER — Other Ambulatory Visit: Payer: Self-pay

## 2019-05-03 ENCOUNTER — Ambulatory Visit (INDEPENDENT_AMBULATORY_CARE_PROVIDER_SITE_OTHER): Payer: BC Managed Care – PPO | Admitting: Physical Medicine and Rehabilitation

## 2019-05-03 VITALS — BP 101/72 | HR 82

## 2019-05-03 DIAGNOSIS — M4328 Fusion of spine, sacral and sacrococcygeal region: Secondary | ICD-10-CM

## 2019-05-03 DIAGNOSIS — M533 Sacrococcygeal disorders, not elsewhere classified: Secondary | ICD-10-CM

## 2019-05-03 DIAGNOSIS — M461 Sacroiliitis, not elsewhere classified: Secondary | ICD-10-CM | POA: Diagnosis not present

## 2019-05-03 NOTE — Progress Notes (Signed)
Numeric Pain Rating Scale and Functional Assessment Average Pain (4) Pain Right Now (40 My pain is right sided low back pain with leg pain    In the last MONTH (on 0-10 scale) has pain interfered with the following?  1. General activity like being  able to carry out your everyday physical activities such as walking, climbing stairs, carrying groceries, or moving a chair?  Rating(8)  2. Relation with others like being able to carry out your usual social activities and roles such as  activities at home, at work and in your community. Rating(8)  3. Enjoyment of life such that you have  been bothered by emotional problems such as feeling anxious, depressed or irritable?  Rating(8)

## 2019-05-04 ENCOUNTER — Encounter: Payer: Self-pay | Admitting: Physical Medicine and Rehabilitation

## 2019-05-04 NOTE — Progress Notes (Signed)
Chelsea Fritz - 49 y.o. female MRN [id number]  Date of birth: [birth year]  Office Visit Note: Visit Date: 05/03/2019 PCP: Wayland Salinas, MD Referred by: Wayland Salinas, *  Subjective: Chief Complaint  Patient presents with  . Lower Back - Pain  . Right Hip - Pain   HPI: Chelsea Fritz is a 49 y.o. female who comes in today For reevaluation of right hip and buttock pain with some referral into the proximal posterior part of the right thigh.  She reports worsening symptoms over the last several months without new injury or trauma.  No paresthesia.  No radicular pain.  No focal weakness.  She reports this is the same pain that she is been having for quite some time and she can tell when the ablation procedure is starting to get to the point where she needs it repeated.  Prior ablations of the right sacroiliac joint are detailed below and can be reviewed fully in the notes.  I rolled medical record could also be reviewed if needed we had ablations performed then.  Briefly her history is that she is treated by Dr. Melina Schools at [facility] for a Workmen's Compensation injury.  Any questions concerning cause-and-effect of an injury can be taken up with him as he is still the treating physician.  Ultimately under his treatment she did receive diagnostic injections of the sacroiliac joints by Dr. Suella Broad.  These were very successful but short-lived particularly on the left side.  At that point a request was made to me to complete radiofrequency ablation of the get longer term relief.  Those ablations have provided significant relief for her for on the right side up to 6 to 8 months of relief.  The left side never seemed to get relief more than a few months.  She ultimately underwent fusion of the left sacroiliac joint and despite hard recovery from that has done extremely well reports really no pain on that side.  She does get worsening symptoms with standing and going sit to  stand.  Some relief with medications.  She states that with the ablation procedure she does not need to take any medication but once it starts hurting again she does have to resort to medications which she does not want to do.  ROS Otherwise per HPI.  Assessment & Plan: Visit Diagnoses:  1. Sacroiliitis ([place])   2. Sacrococcygeal disorders, not elsewhere classified   3. Fusion of spine, sacral and sacrococcygeal region     Plan: Findings:  Chronic worsening severe right buttock and hip pain with some referral back to left upper thigh.  This is a chronic condition for this been ongoing for many years.  She has had history of bilateral sacroiliac joint dysfunction and sacroiliitis.  She is status post left sacroiliac joint fusion by Dr. Melina Schools.  She has had radiofrequency ablation of the right sacroiliac joint on several occasions each 1 lasting 6 months to 8 months.  She does extremely well with the ablation to the point that she requires really no pain medications while it is effective.  Her clinical presentation and exam is also consistent with sacroiliac joint dysfunction and pain.  Given these findings I think it would be wise to repeat the ablation of the right sacroiliac joint.  This may be an ongoing situation her which she does get ablation done every 6 to 8 months and this is sort of an excepted long-term solution especially when looking at joint arthritis that  is amenable to ablation procedures particular lumbar spine.  Ablation procedures are done in a cervical thoracic and lumbar spine with good success.  They are documented to do well and sacroiliac joints are beginning to be done at other joints now as well.  We will try to get approval for repeat sacroiliac joint ablation on the right.    Meds & Orders: No orders of the defined types were placed in this encounter.  No orders of the defined types were placed in this encounter.   Follow-up: Return for Right Sacroiliac Joint  ablation.   Procedures: No procedures performed  No notes on file   Clinical History: Radiofrequency ablation of the right sacroiliac joint:  10/06/2018  10/21/2017  02/22/2017   She reports that she has never smoked. She has never used smokeless tobacco. No results for input(s): HGBA1C, LABURIC in the last 8760 hours.  Objective:  VS:  HT:    WT:   BMI:     BP:101/72  HR:82bpm  TEMP: ( )  RESP:  Physical Exam Vitals and nursing note reviewed.  Constitutional:      General: She is not in acute distress.    Appearance: Normal appearance. She is well-developed.  HENT:     Head: Normocephalic and atraumatic.     Nose: Nose normal.     Mouth/Throat:     Mouth: Mucous membranes are moist.     Pharynx: Oropharynx is clear.  Eyes:     Conjunctiva/sclera: Conjunctivae normal.     Pupils: Pupils are equal, round, and reactive to light.  Cardiovascular:     Rate and Rhythm: Regular rhythm.  Pulmonary:     Effort: Pulmonary effort is normal. No respiratory distress.  Abdominal:     General: There is no distension.     Palpations: Abdomen is soft.     Tenderness: There is no guarding.  Musculoskeletal:     Cervical back: Normal range of motion and neck supple.     Right lower leg: No edema.     Left lower leg: No edema.     Comments: Patient ambulates without aid with fairly normal gait with normal spine posture.  She does have a positive Fortin finger sign over the left buttock.  She has a positive Patrick's test on the left and not the right.  She has no pain over the greater trochanter no pain with internal hip rotation she has good distal strength.  Skin:    General: Skin is warm and dry.     Findings: No erythema or rash.  Neurological:     General: No focal deficit present.     Mental Status: She is alert and oriented to person, place, and time.     Sensory: No sensory deficit.     Motor: No weakness or abnormal muscle tone.     Coordination: Coordination normal.      Gait: Gait normal.  Psychiatric:        Mood and Affect: Mood normal.        Behavior: Behavior normal.        Thought Content: Thought content normal.     Ortho Exam Imaging: No results found.  Past Medical/Family/Surgical/Social History: Medications & Allergies reviewed per EMR, new medications updated. Patient Active Problem List   Diagnosis Date Noted  . 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 Anemia   . Breast mass 01/25/2017   Felt by Dr on 01/13/17  .  Chronic bronchitis (HCC)   . Headache    otc med prn, sleep  . History of kidney stones    passed stone - no surgery required  . 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 Seasonal allergies   . SI (sacroiliac) pain    ablations done - SI joint - no anesthesia, darvocet only  . SVD (spontaneous vaginal delivery)    x 4   Family History  Problem Relation Age of Onset  . Breast cancer Paternal Aunt   . Breast cancer Cousin   . Breast cancer Cousin    Past Surgical History:  Procedure Laterality Date  . ANTERIOR AND POSTERIOR REPAIR WITH SACROSPINOUS FIXATION N/A 10/09/2014   Procedure: ANTERIOR AND POSTERIOR REPAIR WITH SACROSPINOUS LIGAMENT SUSPENSION;  Surgeon: Harold Hedge, MD;  Location: [facility];  Service: Gynecology;  Laterality: N/A;  . BILATERAL SALPINGECTOMY Bilateral 10/09/2014   Procedure: BILATERAL SALPINGECTOMY;  Surgeon: Harold Hedge, MD;  Location: [facility];  Service: Gynecology;  Laterality: Bilateral;  . DILITATION & CURRETTAGE/HYSTROSCOPY WITH NOVASURE ABLATION    . KNEE SURGERY     x 2 - left  . LAPAROSCOPIC ASSISTED VAGINAL HYSTERECTOMY N/A 10/09/2014   Procedure: LAPAROSCOPIC ASSISTED VAGINAL HYSTERECTOMY;  Surgeon: Harold Hedge, MD;  Location: [facility];  Service: Gynecology;  Laterality: N/A;  . NECK SURGERY     ? C4-C5  . ROTATOR CUFF REPAIR     left  . TUBAL LIGATION    . WISDOM TOOTH EXTRACTION     Social History   Occupational  History  . Not on file  Tobacco Use  . Smoking status: Never Smoker  . Smokeless tobacco: Never Used  Substance and Sexual Activity  . Alcohol use: Yes    Comment: social  . Drug use: No  . Sexual activity: Yes    Birth control/protection: None    Comment: ablation

## 2019-05-05 ENCOUNTER — Telehealth: Payer: Self-pay | Admitting: Physical Medicine and Rehabilitation

## 2019-05-09 ENCOUNTER — Other Ambulatory Visit: Payer: Self-pay | Admitting: Physical Medicine and Rehabilitation

## 2019-05-09 ENCOUNTER — Telehealth: Payer: Self-pay | Admitting: Physical Medicine and Rehabilitation

## 2019-05-09 DIAGNOSIS — F411 Generalized anxiety disorder: Secondary | ICD-10-CM

## 2019-05-09 MED ORDER — DIAZEPAM 5 MG PO TABS: ORAL_TABLET | ORAL | 0 refills | Status: DC

## 2019-05-09 NOTE — Telephone Encounter (Signed)
Done

## 2019-05-09 NOTE — Progress Notes (Signed)
Pre-procedure diazepam ordered for pre-operative anxiety.  

## 2019-05-11 ENCOUNTER — Telehealth: Payer: Self-pay

## 2019-05-11 NOTE — Telephone Encounter (Signed)
Received email from [place] requesting all dates where pt saw Dr. Alvester Morin. Did not need records, just date, and signed release was attached. Emailed her back the info. pts treatment started 04/16/14 to present.

## 2019-05-25 ENCOUNTER — Encounter: Payer: Self-pay | Admitting: Physical Medicine and Rehabilitation

## 2019-05-25 ENCOUNTER — Other Ambulatory Visit: Payer: Self-pay

## 2019-05-25 ENCOUNTER — Ambulatory Visit (INDEPENDENT_AMBULATORY_CARE_PROVIDER_SITE_OTHER): Payer: BC Managed Care – PPO | Admitting: Physical Medicine and Rehabilitation

## 2019-05-25 ENCOUNTER — Ambulatory Visit: Payer: Self-pay

## 2019-05-25 VITALS — BP 101/72 | HR 78

## 2019-05-25 DIAGNOSIS — M533 Sacrococcygeal disorders, not elsewhere classified: Secondary | ICD-10-CM

## 2019-05-25 DIAGNOSIS — M461 Sacroiliitis, not elsewhere classified: Secondary | ICD-10-CM | POA: Diagnosis not present

## 2019-05-25 NOTE — Progress Notes (Signed)
 .  Numeric Pain Rating Scale and Functional Assessment Average Pain 4   In the last MONTH (on 0-10 scale) has pain interfered with the following?  1. General activity like being  able to carry out your everyday physical activities such as walking, climbing stairs, carrying groceries, or moving a chair?  Rating(6)   +Driver, -BT, -Dye Allergies.  

## 2019-05-30 NOTE — Progress Notes (Signed)
Chelsea Fritz - 49 y.o. female MRN [id number]  Date of birth: February 01, 1971  Office Visit Note: Visit Date: 05/25/2019 PCP: Wayland Salinas, MD Referred by: Wayland Salinas, *  Subjective: Chief Complaint  Patient presents with  . Lower Back - Pain   HPI:  Chelsea Fritz is a 49 y.o. female who comes in today For planned repeat radiofrequency ablation of the lateral branches of L5, S1 and S2 to denervated the right sacroiliac joint.  Patient status post left sacroiliac joint fixation.  She has had prior success with ablation on the right.  She gets referral pattern in the posterior hip and anterior and posterior leg.  Please see our prior notes for further details and justification.  ROS Otherwise per HPI.  Assessment & Plan: Visit Diagnoses:  1. Sacroiliitis ([place])   2. Sacrococcygeal disorders, not elsewhere classified     Plan: No additional findings.   Meds & Orders: No orders of the defined types were placed in this encounter.   Orders Placed This Encounter  Procedures  . Radiofrequency,Lumbar  . XR C-ARM NO REPORT    Follow-up: Return if symptoms worsen or fail to improve.   Procedures: No procedures performed  Sacroiliac Joint Peripheral Nerve Denervation - Posterior Approach with Fluoroscopic Guidance   Patient: Chelsea Fritz      Date of Birth: July 05, 1970 MRN: [id number] PCP: Wayland Salinas, MD      Visit Date: 05/25/2019   Universal Protocol:    Date/Time: 03/02/216:20 AM  Consent Given By: the patient  Position: PRONE  Additional Comments: Vital signs were monitored before and after the procedure. Patient was prepped and draped in the usual sterile fashion. The correct patient, procedure, and site was verified.   Injection Procedure Details:  Procedure Site One Meds Administered: No orders of the defined types were placed in this encounter.    Laterality: Right  Location/Site:  L5-S1 S1-2 S2-3  Needle size: 18  G  Needle type: RF cannula   Finndings:    -Comments: Excellent biplanar imaging and still evaluation  Procedure Details: For the L5, S1 and S2 dorsal rami the fluoroscope was positioned to square off the sacrum or sacral ala to achieve a true AP midline view.  For the L5 dorsal rami the beam was then obliqued 15 to 20 degrees and caudally tilted 15 to 20 degrees to line up a trajectory along the target nerve. The skin over the target of the junction of superior articulating process and sacral ala was infiltrated with local anesthetic.  The 18 gauge 19mm active tip outer cannula was advanced in trajectory view to the target. The outer cannula placement was fine-tuned and the position was then confirmed with bi-planar imaging.  For the S1 and S2 dorsal rami, no obliquity was utilized but the same caudal tilt was used.  Three targets corresponding to the 12 o'clock and 6 o'clock and the lateral mid-line positions around the foramen were  visualized. The 18 gauge 61mm active tip outer cannula was advanced in trajectory view to each target. The outer cannula placement was fine-tuned and the position was then confirmed with bi-planar imaging  This procedure was repeated for each target nerve position.  Then, for all levels, the outer cannula placement was fine-tuned and the position was then confirmed with bi-planar imaging.    Test stimulation was done both at sensory and motor levels to ensure there was no radicular stimulation. The target tissues were then infiltrated with 1 ml of 1%  Lidocaine without Epinephrine. Subsequently, a percutaneous neurotomy was carried out for 90 seconds at 80 degrees Celsius.   One 60 second neurotomy was performed for each target around the S1 and S2 foramen. After the completion of the respective lesions, 1 ml of injectate was delivered.  Appropriate radiographs were obtained to verify the probe placement during the neurotomy.  Additional Comments:  The patient  tolerated the procedure well Dressing: Band-Aid    Post-procedure details: Patient was observed during the procedure. Post-procedure instructions were reviewed.  Patient left the clinic in stable condition.       Clinical History: Radiofrequency ablation of the right sacroiliac joint:  10/06/2018  10/21/2017  02/22/2017     Objective:  VS:  HT:    WT:   BMI:     BP:101/72  HR:78bpm  TEMP: ( )  RESP:  Physical Exam  Ortho Exam Imaging: No results found.

## 2019-05-30 NOTE — Procedures (Signed)
Sacroiliac Joint Peripheral Nerve Denervation - Posterior Approach with Fluoroscopic Guidance   Patient: Chelsea Fritz      Date of Birth: 27-Jul-1970 MRN: [id number] PCP: Deloris Ping, MD      Visit Date: 05/25/2019   Universal Protocol:    Date/Time: 03/02/216:20 AM  Consent Given By: the patient  Position: PRONE  Additional Comments: Vital signs were monitored before and after the procedure. Patient was prepped and draped in the usual sterile fashion. The correct patient, procedure, and site was verified.   Injection Procedure Details:  Procedure Site One Meds Administered: No orders of the defined types were placed in this encounter.    Laterality: Right  Location/Site:  L5-S1 S1-2 S2-3  Needle size: 18 G  Needle type: RF cannula   Finndings:    -Comments: Excellent biplanar imaging and still evaluation  Procedure Details: For the L5, S1 and S2 dorsal rami the fluoroscope was positioned to square off the sacrum or sacral ala to achieve a true AP midline view.  For the L5 dorsal rami the beam was then obliqued 15 to 20 degrees and caudally tilted 15 to 20 degrees to line up a trajectory along the target nerve. The skin over the target of the junction of superior articulating process and sacral ala was infiltrated with local anesthetic.  The 18 gauge 52mm active tip outer cannula was advanced in trajectory view to the target. The outer cannula placement was fine-tuned and the position was then confirmed with bi-planar imaging.  For the S1 and S2 dorsal rami, no obliquity was utilized but the same caudal tilt was used.  Three targets corresponding to the 12 o'clock and 6 o'clock and the lateral mid-line positions around the foramen were  visualized. The 18 gauge 65mm active tip outer cannula was advanced in trajectory view to each target. The outer cannula placement was fine-tuned and the position was then confirmed with bi-planar imaging  This procedure was  repeated for each target nerve position.  Then, for all levels, the outer cannula placement was fine-tuned and the position was then confirmed with bi-planar imaging.    Test stimulation was done both at sensory and motor levels to ensure there was no radicular stimulation. The target tissues were then infiltrated with 1 ml of 1% Lidocaine without Epinephrine. Subsequently, a percutaneous neurotomy was carried out for 90 seconds at 80 degrees Celsius.   One 60 second neurotomy was performed for each target around the S1 and S2 foramen. After the completion of the respective lesions, 1 ml of injectate was delivered.  Appropriate radiographs were obtained to verify the probe placement during the neurotomy.  Additional Comments:  The patient tolerated the procedure well Dressing: Band-Aid    Post-procedure details: Patient was observed during the procedure. Post-procedure instructions were reviewed.  Patient left the clinic in stable condition.

## 2019-12-11 ENCOUNTER — Other Ambulatory Visit: Payer: BC Managed Care – PPO

## 2019-12-12 ENCOUNTER — Other Ambulatory Visit: Payer: BC Managed Care – PPO

## 2020-03-05 ENCOUNTER — Telehealth: Payer: Self-pay

## 2020-03-05 NOTE — Telephone Encounter (Signed)
Yesterday I received an email that I was cc'd on from Jeb Levering with pts atty where she is advising pts wc adj Alvester Chou @ [facility] that pt has been in contact with her and is needing another SI injection with Dr. Alvester Morin and wanting to see if this is approved.Alvester Chou e-mailed me stating she never received the Feb 21 notes from where the pt was seen last. E-mailed these to her @ [email or web address]. Claim#[id number]

## 2020-07-29 ENCOUNTER — Telehealth: Payer: Self-pay

## 2020-07-29 NOTE — Telephone Encounter (Signed)
Jeb Levering e-mailed me to see if pt has been seen by Dr. Alvester Morin since 05/25/19? Advised her no. Need Dr. Alvester Morin to sign a form 65m from [facility] in order to keep medicals open in case she needs additional ablations in the future with him. Gave form to Dr. Alvester Morin for review/signature.

## 2020-10-08 ENCOUNTER — Other Ambulatory Visit: Payer: Self-pay

## 2020-10-08 ENCOUNTER — Ambulatory Visit (INDEPENDENT_AMBULATORY_CARE_PROVIDER_SITE_OTHER): Payer: BC Managed Care – PPO | Admitting: Physical Medicine and Rehabilitation

## 2020-10-08 ENCOUNTER — Encounter: Payer: Self-pay | Admitting: Physical Medicine and Rehabilitation

## 2020-10-08 VITALS — BP 99/75 | HR 93

## 2020-10-08 DIAGNOSIS — M461 Sacroiliitis, not elsewhere classified: Secondary | ICD-10-CM | POA: Diagnosis not present

## 2020-10-08 DIAGNOSIS — M4328 Fusion of spine, sacral and sacrococcygeal region: Secondary | ICD-10-CM | POA: Diagnosis not present

## 2020-10-08 DIAGNOSIS — M533 Sacrococcygeal disorders, not elsewhere classified: Secondary | ICD-10-CM | POA: Diagnosis not present

## 2020-10-08 NOTE — Progress Notes (Signed)
Chelsea Fritz - 49 y.o. female MRN [id number]  Date of birth: 1970/08/12  Office Visit Note: Visit Date: 10/08/2020 PCP: Deloris Ping, MD Referred by: Deloris Ping, *  Subjective: Chief Complaint  Patient presents with   Lower Back - Pain   HPI: Chelsea Fritz is a 50 y.o. female who comes in today For evaluation and management of continued right sacroiliac joint pain.  The last time I saw her was in February 2021 and we completed an ablation of the right sacroiliac joint lateral branch nerves.  She has had this on several occasions with good consistent relief.  She reported relief with that procedure up until February of this year so this did last about a year.  She reports return of pain in the right sacroiliac joint area with some referral down the leg.  Her case has always been interesting that she gets referral pattern almost consistent with a radicular type symptoms down the leg but this can happen with sacroiliac joint referral pain.  She rates her pain now on average is a 4 out of 10 but it can worsen at times.  It is a constant dull and aching pain.  She feels like it is in exactly the same area.  Is worse with sitting and standing and some activities.  Better temporarily with medications and rest but not great.  She has had no other new symptoms or trauma or falls or focal weakness.  Her history is significant that she originally saw Dr. Venita Lick at [facility].  She has undergone left sacroiliac joint fusion.  Prior radiofrequency ablation of the left sacroiliac joint was beneficial as well but did not last as long.  Review of Systems  Musculoskeletal:  Positive for back pain and joint pain.  All other systems reviewed and are negative. Otherwise per HPI.  Assessment & Plan: Visit Diagnoses:    ICD-10-CM   1. Sacroiliitis (HCC)  M46.1     2. Sacrococcygeal disorders, not elsewhere classified  M53.3     3. Fusion of spine, sacral and sacrococcygeal  region  M43.28        Plan: Findings:  Chronic recalcitrant low back and buttock pain consistent with sacroiliac joint mediated pain as diagnosed by prior sacroiliac joint injections.  She has had physical therapy and home exercise program.  She has had left sacroiliac joint fusion.  She has had multiple right-sided sacroiliac joint denervation of the lateral branches with good relief with the last one lasting up to a year.  Pain returned past February without any new symptoms.  I do think it is pertinent to repeat the procedure.  The lateral branch nerves do regenerate and these can take anywhere from 6 months to a year to a year and a half.  This is likely something she will have to have repeated over time.  She will continue with current home exercise plan and medications.   Meds & Orders: No orders of the defined types were placed in this encounter.  No orders of the defined types were placed in this encounter.   Follow-up: No follow-ups on file.   Procedures: No procedures performed      Clinical History: Radiofrequency ablation of the right sacroiliac joint:  10/06/2018  10/21/2017  02/22/2017   She reports that she has never smoked. She has never used smokeless tobacco. No results for input(s): HGBA1C, LABURIC in the last 8760 hours.  Objective:  VS:  HT:    WT:  BMI:     BP:99/75  HR:93bpm  TEMP: ( )  RESP:  Physical Exam Vitals and nursing note reviewed.  Constitutional:      General: She is not in acute distress.    Appearance: Normal appearance. She is not ill-appearing.  HENT:     Head: Normocephalic and atraumatic.     Right Ear: External ear normal.     Left Ear: External ear normal.  Eyes:     Extraocular Movements: Extraocular movements intact.  Cardiovascular:     Rate and Rhythm: Normal rate.     Pulses: Normal pulses.  Pulmonary:     Effort: Pulmonary effort is normal. No respiratory distress.  Abdominal:     General: There is no distension.      Palpations: Abdomen is soft.  Musculoskeletal:        General: Tenderness present.     Cervical back: Neck supple.     Right lower leg: No edema.     Left lower leg: No edema.     Comments: Patient has good distal strength with no pain over the greater trochanters.  No clonus or focal weakness.  Positive Fortin finger sign on the right.  Positive Patrick's.  Skin:    Findings: No erythema, lesion or rash.  Neurological:     General: No focal deficit present.     Mental Status: She is alert and oriented to person, place, and time.     Sensory: No sensory deficit.     Motor: No weakness or abnormal muscle tone.     Coordination: Coordination normal.  Psychiatric:        Mood and Affect: Mood normal.        Behavior: Behavior normal.    Ortho Exam  Imaging: No results found.  Past Medical/Family/Surgical/Social History: Medications & Allergies reviewed per EMR, new medications updated. 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   Seasonal allergies    SI (sacroiliac) pain    ablations done - SI joint - no anesthesia, darvocet only   SVD (spontaneous vaginal delivery)    x 4   Family History  Problem Relation Age of Onset   Breast cancer Paternal Aunt    Breast cancer Cousin    Breast cancer Cousin    Past Surgical History:  Procedure Laterality Date   ANTERIOR AND POSTERIOR REPAIR WITH SACROSPINOUS FIXATION N/A 10/09/2014   Procedure: ANTERIOR AND POSTERIOR REPAIR WITH SACROSPINOUS LIGAMENT SUSPENSION;  Surgeon: Harold Hedge, MD;  Location: [facility];  Service: Gynecology;  Laterality: N/A;   BILATERAL SALPINGECTOMY Bilateral 10/09/2014    Procedure: BILATERAL SALPINGECTOMY;  Surgeon: Harold Hedge, MD;  Location: [facility];  Service: Gynecology;  Laterality: Bilateral;   DILITATION & CURRETTAGE/HYSTROSCOPY WITH NOVASURE ABLATION     KNEE SURGERY     x 2 - left   LAPAROSCOPIC ASSISTED VAGINAL HYSTERECTOMY N/A 10/09/2014   Procedure: LAPAROSCOPIC ASSISTED VAGINAL HYSTERECTOMY;  Surgeon: Harold Hedge, MD;  Location: [facility];  Service: Gynecology;  Laterality: N/A;   NECK SURGERY     ? C4-C5  ROTATOR CUFF REPAIR     left   TUBAL LIGATION     WISDOM TOOTH EXTRACTION     Social History   Occupational History   Not on file  Tobacco Use   Smoking status: Never   Smokeless tobacco: Never  Substance and Sexual Activity   Alcohol use: Yes    Comment: social   Drug use: No   Sexual activity: Yes    Birth control/protection: None    Comment: ablation

## 2020-10-08 NOTE — Progress Notes (Signed)
Right SI joint RFA 05/25/2019. Good relief from ablation. Pain has returned in same area. Began hurting again in February. Numeric Pain Rating Scale and Functional Assessment Average Pain 4 Pain Right Now 4 My pain is constant, dull, and aching Pain is worse with: sitting, standing, and some activites Pain improves with: rest   In the last MONTH (on 0-10 scale) has pain interfered with the following?  1. General activity like being  able to carry out your everyday physical activities such as walking, climbing stairs, carrying groceries, or moving a chair?  Rating(6)  2. Relation with others like being able to carry out your usual social activities and roles such as  activities at home, at work and in your community. Rating(6)  3. Enjoyment of life such that you have  been bothered by emotional problems such as feeling anxious, depressed or irritable?  Rating(7)

## 2020-10-09 ENCOUNTER — Encounter: Payer: Self-pay | Admitting: Physical Medicine and Rehabilitation

## 2020-10-15 ENCOUNTER — Other Ambulatory Visit: Payer: Self-pay | Admitting: Physical Medicine and Rehabilitation

## 2020-10-15 DIAGNOSIS — F411 Generalized anxiety disorder: Secondary | ICD-10-CM

## 2020-10-15 MED ORDER — DIAZEPAM 5 MG PO TABS: ORAL_TABLET | ORAL | 0 refills | Status: AC

## 2020-10-15 NOTE — Progress Notes (Signed)
Pre-procedure diazepam ordered for pre-operative anxiety.  

## 2020-10-29 ENCOUNTER — Telehealth: Payer: Self-pay | Admitting: Physical Medicine and Rehabilitation

## 2020-10-29 NOTE — Telephone Encounter (Signed)
Pt called stating she needs to R/S her appt that's currently set for 10/30/20

## 2020-10-29 NOTE — Telephone Encounter (Signed)
Left message #1 to reschedule appointment. 

## 2020-10-30 ENCOUNTER — Encounter: Payer: BC Managed Care – PPO | Admitting: Physical Medicine and Rehabilitation

## 2020-10-30 NOTE — Telephone Encounter (Signed)
Rescheduled

## 2020-11-20 ENCOUNTER — Encounter: Payer: Self-pay | Admitting: Physical Medicine and Rehabilitation

## 2020-11-20 ENCOUNTER — Other Ambulatory Visit: Payer: Self-pay

## 2020-11-20 ENCOUNTER — Ambulatory Visit: Payer: Self-pay

## 2020-11-20 ENCOUNTER — Ambulatory Visit (INDEPENDENT_AMBULATORY_CARE_PROVIDER_SITE_OTHER): Payer: BC Managed Care – PPO | Admitting: Physical Medicine and Rehabilitation

## 2020-11-20 VITALS — BP 106/73 | HR 134

## 2020-11-20 DIAGNOSIS — M461 Sacroiliitis, not elsewhere classified: Secondary | ICD-10-CM | POA: Diagnosis not present

## 2020-11-20 DIAGNOSIS — M533 Sacrococcygeal disorders, not elsewhere classified: Secondary | ICD-10-CM

## 2020-11-20 MED ORDER — BETAMETHASONE SOD PHOS & ACET 6 (3-3) MG/ML IJ SUSP
12.0000 mg | Freq: Once | INTRAMUSCULAR | Status: AC
  Administered 2020-11-20: 12 mg

## 2020-11-20 NOTE — Progress Notes (Signed)
Pt state lower back pain that travels to her right buttock and down to her right thigh and leg. Pt state walking, standing and  bending makes the pain worse. Pt state she takes pain meds to help ease her pain.   Numeric Pain Rating Scale and Functional Assessment Average Pain 6   In the last MONTH (on 0-10 scale) has pain interfered with the following?  1. General activity like being  able to carry out your everyday physical activities such as walking, climbing stairs, carrying groceries, or moving a chair?  Rating(7)   +Driver, -BT, -Dye Allergies.

## 2020-11-20 NOTE — Patient Instructions (Signed)

## 2020-11-22 ENCOUNTER — Other Ambulatory Visit: Payer: Self-pay | Admitting: Obstetrics and Gynecology

## 2020-11-22 DIAGNOSIS — Z9189 Other specified personal risk factors, not elsewhere classified: Secondary | ICD-10-CM

## 2020-12-04 NOTE — Procedures (Signed)
Sacroiliac Joint Peripheral Nerve Denervation - Posterior Approach with Fluoroscopic Guidance   Patient: Chelsea Fritz      Date of Birth: 1970-12-28 MRN: [id number] PCP: Deloris Ping, MD      Visit Date: 11/20/2020   Universal Protocol:    Date/Time: 09/07/226:03 AM  Consent Given By: the patient  Position: PRONE  Additional Comments: Vital signs were monitored before and after the procedure. Patient was prepped and draped in the usual sterile fashion. The correct patient, procedure, and site was verified.   Injection Procedure Details:  Procedure Site One Meds Administered:  Meds ordered this encounter  Medications   betamethasone acetate-betamethasone sodium phosphate (CELESTONE) injection 12 mg     Laterality: Right  Location/Site: Lateral branches L5, S1 and S2 L5-S1 S1-2 S2-3  Needle size: 18 G  Needle type: Cosman/[employer] radiofrequency cannula  Findings:    -Comments: Good biplanar imaging showing cannula localization with good motor stimulation.  Procedure Details: For the L5, S1 and S2 dorsal rami the fluoroscope was positioned to square off the sacrum or sacral ala to achieve a true AP midline view.  For the L5 dorsal rami the beam was then obliqued 15 to 20 degrees and caudally tilted 15 to 20 degrees to line up a trajectory along the target nerve. The skin over the target of the junction of superior articulating process and sacral ala was infiltrated with local anesthetic.  The 18 gauge 19mm active tip outer cannula was advanced in trajectory view to the target. The outer cannula placement was fine-tuned and the position was then confirmed with bi-planar imaging.  For the S1 and S2 dorsal rami, no obliquity was utilized but the same caudal tilt was used.  Three targets corresponding to the 12 o'clock and 6 o'clock and the lateral mid-line positions around the foramen were  visualized. The 18 gauge 23mm active tip outer cannula was  advanced in trajectory view to each target. The outer cannula placement was fine-tuned and the position was then confirmed with bi-planar imaging  This procedure was repeated for each target nerve position.  Then, for all levels, the outer cannula placement was fine-tuned and the position was then confirmed with bi-planar imaging.    Test stimulation was done both at sensory and motor levels to ensure there was no radicular stimulation. The target tissues were then infiltrated with 1 ml of 1% Lidocaine without Epinephrine. Subsequently, a percutaneous neurotomy was carried out for90 seconds at 80 degrees Celsius.  One 60 second neurotomy was performed for each target around the S1 and S2 foramen. After the completion of the respective lesions, 1 ml of injectate was delivered.  Appropriate radiographs were obtained to verify the probe placement during the neurotomy.  Additional Comments:  The patient tolerated the procedure well Dressing: Band-Aid    Post-procedure details: Patient was observed during the procedure. Post-procedure instructions were reviewed. Follow-Up Instructions: Given as patient handout Patient left the clinic in stable condition.

## 2020-12-04 NOTE — Progress Notes (Signed)
Chelsea Fritz - 50 y.o. female MRN [id number]  Date of birth: 02/04/1971  Office Visit Note: Visit Date: 11/20/2020 PCP: Deloris Ping, MD Referred by: Deloris Ping, *  Subjective: Chief Complaint  Patient presents with   Lower Back - Pain   Right Thigh - Pain   Right Leg - Pain   HPI:  Chelsea Fritz is a 50 y.o. female who comes in today For planned repeat right sacroiliac joint denervation which is ablation of the L5 and S1 and S2 lateral branches on the right.  Patient is status post left sacroiliac joint fusion.  We have seen her recently for reevaluation.  This has been an ongoing problem for her and she has had repeat ablations that have done fairly well and those can be reviewed.  She has done physical therapy and continues with home management with medication and exercises.  She did get more than 50% relief with the last procedure.  ROS Otherwise per HPI.  Assessment & Plan: Visit Diagnoses:    ICD-10-CM   1. Sacroiliitis (HCC)  M46.1 XR C-ARM NO REPORT    betamethasone acetate-betamethasone sodium phosphate (CELESTONE) injection 12 mg    Radiofrequency,Lumbar    2. Sacrococcygeal disorders, not elsewhere classified  M53.3 XR C-ARM NO REPORT    betamethasone acetate-betamethasone sodium phosphate (CELESTONE) injection 12 mg    Radiofrequency,Lumbar      Plan: No additional findings.   Meds & Orders:  Meds ordered this encounter  Medications   betamethasone acetate-betamethasone sodium phosphate (CELESTONE) injection 12 mg    Orders Placed This Encounter  Procedures   Radiofrequency,Lumbar   XR C-ARM NO REPORT    Follow-up: Return if symptoms worsen or fail to improve.   Procedures: No procedures performed  Sacroiliac Joint Peripheral Nerve Denervation - Posterior Approach with Fluoroscopic Guidance   Patient: Chelsea Fritz      Date of Birth: 09-09-1970 MRN: [id number] PCP: Deloris Ping, MD      Visit Date: 11/20/2020    Universal Protocol:    Date/Time: 09/07/226:03 AM  Consent Given By: the patient  Position: PRONE  Additional Comments: Vital signs were monitored before and after the procedure. Patient was prepped and draped in the usual sterile fashion. The correct patient, procedure, and site was verified.   Injection Procedure Details:  Procedure Site One Meds Administered:  Meds ordered this encounter  Medications   betamethasone acetate-betamethasone sodium phosphate (CELESTONE) injection 12 mg     Laterality: Right  Location/Site: Lateral branches L5, S1 and S2 L5-S1 S1-2 S2-3  Needle size: 18 G  Needle type: Cosman/[employer] radiofrequency cannula  Findings:    -Comments: Good biplanar imaging showing cannula localization with good motor stimulation.  Procedure Details: For the L5, S1 and S2 dorsal rami the fluoroscope was positioned to square off the sacrum or sacral ala to achieve a true AP midline view.  For the L5 dorsal rami the beam was then obliqued 15 to 20 degrees and caudally tilted 15 to 20 degrees to line up a trajectory along the target nerve. The skin over the target of the junction of superior articulating process and sacral ala was infiltrated with local anesthetic.  The 18 gauge 56mm active tip outer cannula was advanced in trajectory view to the target. The outer cannula placement was fine-tuned and the position was then confirmed with bi-planar imaging.  For the S1 and S2 dorsal rami, no obliquity was utilized but the same caudal tilt was used.  Three  targets corresponding to the 12 o'clock and 6 o'clock and the lateral mid-line positions around the foramen were  visualized. The 18 gauge 22mm active tip outer cannula was advanced in trajectory view to each target. The outer cannula placement was fine-tuned and the position was then confirmed with bi-planar imaging  This procedure was repeated for each target nerve position.  Then, for all levels, the  outer cannula placement was fine-tuned and the position was then confirmed with bi-planar imaging.    Test stimulation was done both at sensory and motor levels to ensure there was no radicular stimulation. The target tissues were then infiltrated with 1 ml of 1% Lidocaine without Epinephrine. Subsequently, a percutaneous neurotomy was carried out for90 seconds at 80 degrees Celsius.  One 60 second neurotomy was performed for each target around the S1 and S2 foramen. After the completion of the respective lesions, 1 ml of injectate was delivered.  Appropriate radiographs were obtained to verify the probe placement during the neurotomy.  Additional Comments:  The patient tolerated the procedure well Dressing: Band-Aid    Post-procedure details: Patient was observed during the procedure. Post-procedure instructions were reviewed. Follow-Up Instructions: Given as patient handout Patient left the clinic in stable condition.      Clinical History: Radiofrequency ablation of the right sacroiliac joint:  10/06/2018  10/21/2017  02/22/2017     Objective:  VS:  HT:    WT:   BMI:     BP:106/73  HR:(!) 134bpm  TEMP: ( )  RESP:  Physical Exam Vitals and nursing note reviewed.  Constitutional:      General: She is not in acute distress.    Appearance: Normal appearance. She is not ill-appearing.  HENT:     Head: Normocephalic and atraumatic.     Right Ear: External ear normal.     Left Ear: External ear normal.  Eyes:     Extraocular Movements: Extraocular movements intact.  Cardiovascular:     Rate and Rhythm: Normal rate.     Pulses: Normal pulses.  Pulmonary:     Effort: Pulmonary effort is normal. No respiratory distress.  Abdominal:     General: There is no distension.     Palpations: Abdomen is soft.  Musculoskeletal:        General: Tenderness present.     Cervical back: Neck supple.     Right lower leg: No edema.     Left lower leg: No edema.     Comments: Patient  has good distal strength with no pain over the greater trochanters.  No clonus or focal weakness.  Positive Fortin finger sign on the right.  Equivocal Patrick's positive on the right.  Skin:    Findings: No erythema, lesion or rash.  Neurological:     General: No focal deficit present.     Mental Status: She is alert and oriented to person, place, and time.     Sensory: No sensory deficit.     Motor: No weakness or abnormal muscle tone.     Coordination: Coordination normal.  Psychiatric:        Mood and Affect: Mood normal.        Behavior: Behavior normal.     Imaging: No results found.

## 2021-03-20 ENCOUNTER — Ambulatory Visit (INDEPENDENT_AMBULATORY_CARE_PROVIDER_SITE_OTHER): Payer: BC Managed Care – PPO

## 2021-03-20 ENCOUNTER — Encounter: Payer: Self-pay | Admitting: Podiatry

## 2021-03-20 ENCOUNTER — Ambulatory Visit: Payer: BC Managed Care – PPO | Admitting: Podiatry

## 2021-03-20 ENCOUNTER — Other Ambulatory Visit: Payer: Self-pay

## 2021-03-20 ENCOUNTER — Telehealth: Payer: Self-pay | Admitting: *Deleted

## 2021-03-20 DIAGNOSIS — M7732 Calcaneal spur, left foot: Secondary | ICD-10-CM

## 2021-03-20 DIAGNOSIS — M76821 Posterior tibial tendinitis, right leg: Secondary | ICD-10-CM

## 2021-03-20 DIAGNOSIS — M76822 Posterior tibial tendinitis, left leg: Secondary | ICD-10-CM

## 2021-03-20 DIAGNOSIS — M79672 Pain in left foot: Secondary | ICD-10-CM

## 2021-03-20 DIAGNOSIS — M7731 Calcaneal spur, right foot: Secondary | ICD-10-CM

## 2021-03-20 DIAGNOSIS — M79671 Pain in right foot: Secondary | ICD-10-CM | POA: Diagnosis not present

## 2021-03-20 DIAGNOSIS — M722 Plantar fascial fibromatosis: Secondary | ICD-10-CM | POA: Diagnosis not present

## 2021-03-20 DIAGNOSIS — M2041 Other hammer toe(s) (acquired), right foot: Secondary | ICD-10-CM | POA: Diagnosis not present

## 2021-03-20 MED ORDER — DEXAMETHASONE SODIUM PHOSPHATE 120 MG/30ML IJ SOLN
4.0000 mg | Freq: Once | INTRAMUSCULAR | Status: AC
  Administered 2021-03-20: 12:00:00 4 mg via INTRA_ARTICULAR

## 2021-03-20 NOTE — Telephone Encounter (Signed)
Patient is calling , has some questions for the doctor: how long should she wear the boot each day/ what should she wear with the boot, such as a sock , etc,Please advise.

## 2021-03-20 NOTE — Progress Notes (Signed)
Subjective:  Patient ID: Chelsea Fritz, female    DOB: 08-Jul-1970,   MRN: [id number]  Chief Complaint  Patient presents with   Heel Spurs     bone spur in heel right foot , Bil foot pain , second toe curving     50 y.o. female presents for concern of bilateral heel pain that has been present for several months. Relates she stepped off a ladder wrong and had X-rays taken and was told she had a heel spur. Also relates her second toe is curling under and feels pain at the bottom of the foot. She relates general foot pain for the past two months and hurts at night when she is sleeping relates cramping and sometimes it hurts to walk States she does have tingling and burning and shooting pain up the leg. Has a history of back issues . Denies any other pedal complaints. Denies n/v/f/c.   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 CFT <3 seconds. Normal hair growth on digits. No edema.  Skin. No lacerations or abrasions bilateral feet.  Musculoskeletal: MMT 5/5 bilateral lower extremities in DF, PF, Inversion and Eversion. Deceased ROM in DF of ankle joint. Tender to medial calcaneal tubercle primarily on the right foot. Tenderness along the PT tendon bilateral. No pain along the achilles or peroneal tendon. No pain with medial calcaneal squeeze. Minimal tenderness to plantar second metatarsophalangeal joint. Hammering of digits 2-5 bilateral with standing.  Neurological: Sensation intact to light touch.   Assessment:   1.  Plantar fasciitis, left   2. Plantar fasciitis, right   3. Posterior tibial tendon dysfunction (PTTD) of both lower extremities   4. Hammer toe of right foot      Plan:  Patient was evaluated and treated and all questions answered. X-rays reviewed and discussed with patient. No acute fractures or dislocations noted.   Discussed plantar fasciitis and posterior tibial tendonitis  with patient. Also discussed hammertoes.  X-rays reviewed and discussed with patient. No acute fractures or dislocations noted. Mild spurring noted at inferior calcaneus.  Discussed treatment options including, ice, NSAIDS, supportive shoes, bracing, and stretching. Stretching exercises provided to be done on a daily basis.   Continue with meloxicam.  PF brace dispensed.  Patient requesting injection today for plantar fascia. Procedure note below.   Follow-up 6 weeks or sooner if any problems arise. In the meantime, encouraged to call the office with any questions, concerns, change in symptoms.   Procedure:  Discussed etiology, pathology, conservative vs. surgical therapies. At this time a plantar fascial injection was recommended.  The patient agreed and a sterile skin prep was applied.  An injection consisting of  dexamethasone and marcaine mixture was infiltrated at the point of maximal tenderness on the right Heel.  Bandaid applied. The patient tolerated this well and was given instructions for aftercare.      Louann Sjogren, DPM

## 2021-03-20 NOTE — Patient Instructions (Signed)

## 2021-05-01 ENCOUNTER — Ambulatory Visit: Payer: BC Managed Care – PPO | Admitting: Podiatry

## 2021-05-01 ENCOUNTER — Other Ambulatory Visit: Payer: Self-pay

## 2021-05-01 ENCOUNTER — Encounter: Payer: Self-pay | Admitting: Podiatry

## 2021-05-01 DIAGNOSIS — M7752 Other enthesopathy of left foot: Secondary | ICD-10-CM

## 2021-05-01 DIAGNOSIS — M2041 Other hammer toe(s) (acquired), right foot: Secondary | ICD-10-CM

## 2021-05-01 DIAGNOSIS — M722 Plantar fascial fibromatosis: Secondary | ICD-10-CM | POA: Diagnosis not present

## 2021-05-01 DIAGNOSIS — M2042 Other hammer toe(s) (acquired), left foot: Secondary | ICD-10-CM

## 2021-05-01 DIAGNOSIS — M7751 Other enthesopathy of right foot: Secondary | ICD-10-CM | POA: Diagnosis not present

## 2021-05-01 MED ORDER — DEXAMETHASONE SODIUM PHOSPHATE 120 MG/30ML IJ SOLN: 4.0000 mg | Freq: Once | INTRAMUSCULAR | Status: AC

## 2021-05-01 NOTE — Progress Notes (Signed)
Subjective:  Patient ID: Chelsea Fritz, female    DOB: 1970/09/09,   MRN: [id number]  Chief Complaint  Patient presents with   Plantar Fasciitis    Bilateral foot pain f/u. Patient states right foot is much better than the left but she is still experiencing some pain.    51 y.o. female presents for follow-up of bilateral foot pain and plantar fasciiits. Relates right foot is doing better there is still some pain occasionally. Injection helped and is wearing the brace.   States the left foot is worse since the last visit States she does have tingling and burning and shooting pain up the leg. Has a history of back issues . Denies any other pedal complaints. Denies n/v/f/c.   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 CFT <3 seconds. Normal hair growth on digits. No edema.  Skin. No lacerations or abrasions bilateral feet.  Musculoskeletal: MMT 5/5 bilateral lower extremities in DF, PF, Inversion and Eversion. Deceased ROM in DF of ankle joint. Tender to medial calcaneal tubercle primarily on the left foot today. No tenderness along the PT tendon bilateral. No pain along the achilles or peroneal tendon. No pain with medial calcaneal squeeze. Minimal tenderness to plantar second metatarsophalangeal joint bilateral. Hammering of digits 2-5 bilateral with standing.  Neurological: Sensation intact to light touch.   Assessment:   1. Plantar fasciitis, left   2. Plantar fasciitis, right   3. Capsulitis of toe of right foot   4.  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 Hammertoe, bilateral       Plan:  Patient was evaluated and treated and all questions answered. Discussed plantar fasciitis and posterior tibial tendonitis  with patient. Also discussed hammertoes.  X-rays reviewed and discussed with patient. No acute fractures or dislocations noted. Mild spurring noted at inferior calcaneus.  Discussed treatment options including, ice, NSAIDS, supportive shoes, bracing, and stretching.  Continue stretching .   Continue with meloxicam.  Continue with PF brace Patient requesting injection today for plantar fascia on left this time. Procedure note below.   Discussed capsulitis and inflammation of joint and treatment options with patient.  Discussed padding and provided crest pads and metatarsal pads.  Discussed if pain does not improve may consider PT and/or MRI for further surgical planning.  Follow-up 6 weeks or sooner if any problems arise. In the meantime, encouraged to call the office with any questions, concerns, change in symptoms.   Procedure:  Discussed etiology, pathology, conservative vs. surgical therapies. At this time a plantar fascial injection was recommended.  The patient agreed and a sterile skin prep was applied.  An injection consisting of  dexamethasone and marcaine mixture was infiltrated at the point of maximal tenderness on the left Heel.  Bandaid applied. The patient tolerated this well and was given instructions for aftercare.      Louann Sjogren, DPM

## 2021-05-01 NOTE — Patient Instructions (Signed)

## 2021-06-12 ENCOUNTER — Ambulatory Visit: Payer: BC Managed Care – PPO | Admitting: Podiatry

## 2022-06-11 ENCOUNTER — Telehealth: Payer: Self-pay | Admitting: Physical Medicine and Rehabilitation

## 2022-06-11 NOTE — Telephone Encounter (Signed)
Patient needs appointment for an ablation

## 2022-06-15 NOTE — Telephone Encounter (Signed)
Spoke with patient and she is wanting another ablation. She stated she is WC. After speaking with Tisha, [facility] coordinator, she stated she would have to go through her adjuster and get approval for OV and RFA. Informed patient and verbalized understanding

## 2023-11-01 IMAGING — DX DG FOOT COMPLETE 3+V*R*
3 series · 3 of 3 positions shown · non-contrast
Comparison: None.

CLINICAL DATA: Heel spur.  Pain.

EXAM:
RIGHT FOOT COMPLETE - 3+ VIEW

[foot obl wb]
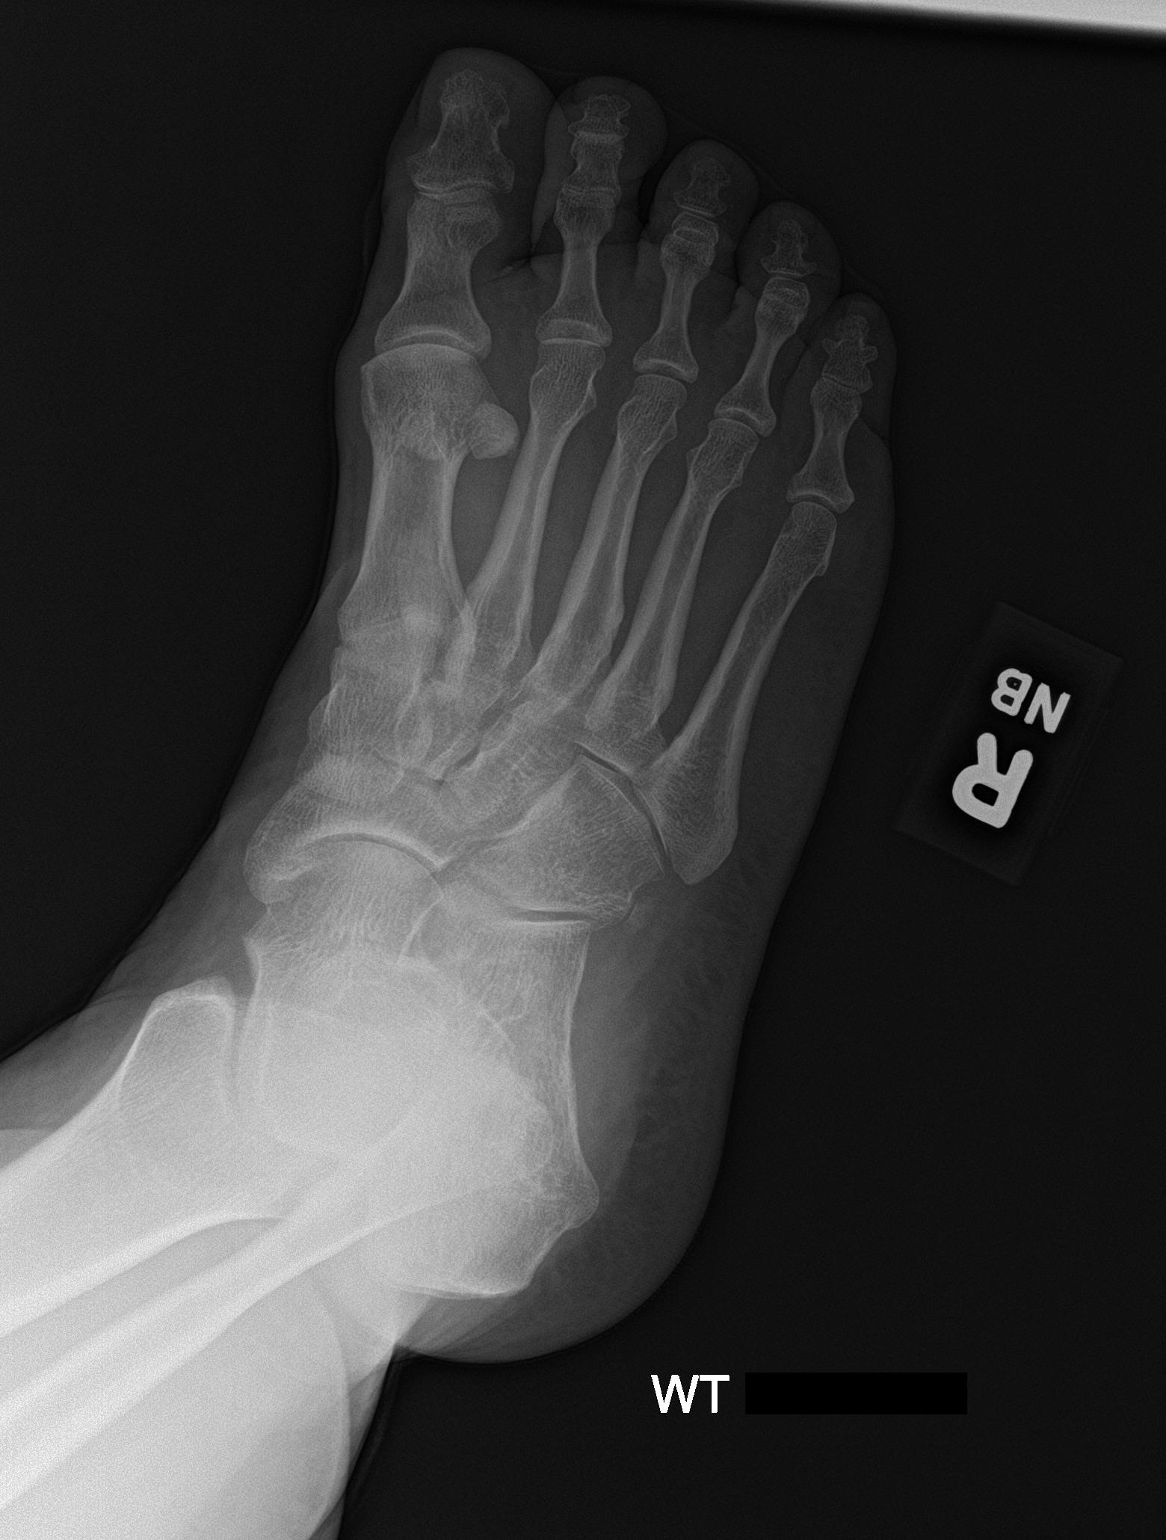

[foot lat wb]
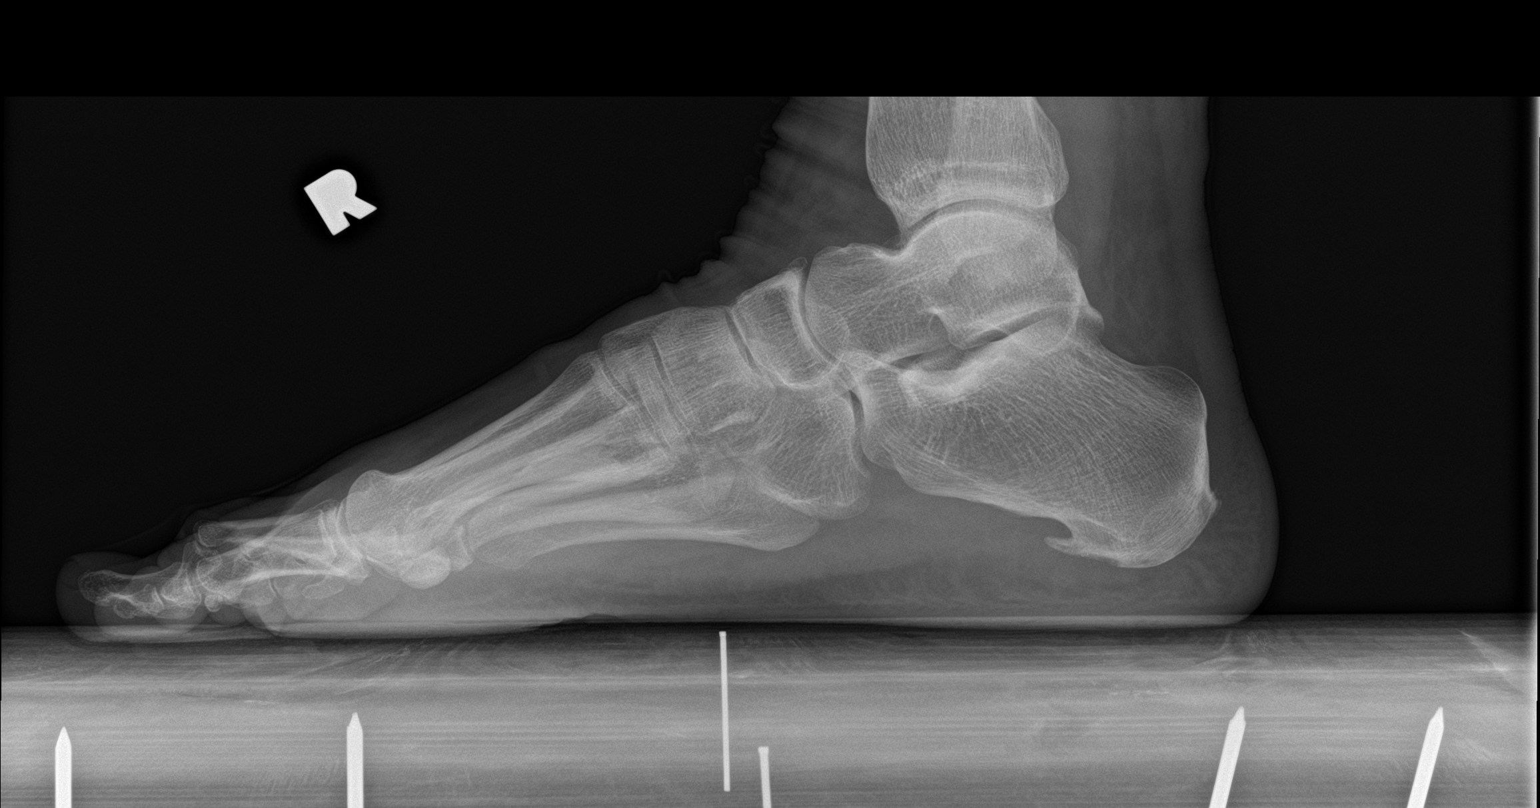

[foot ap wb]
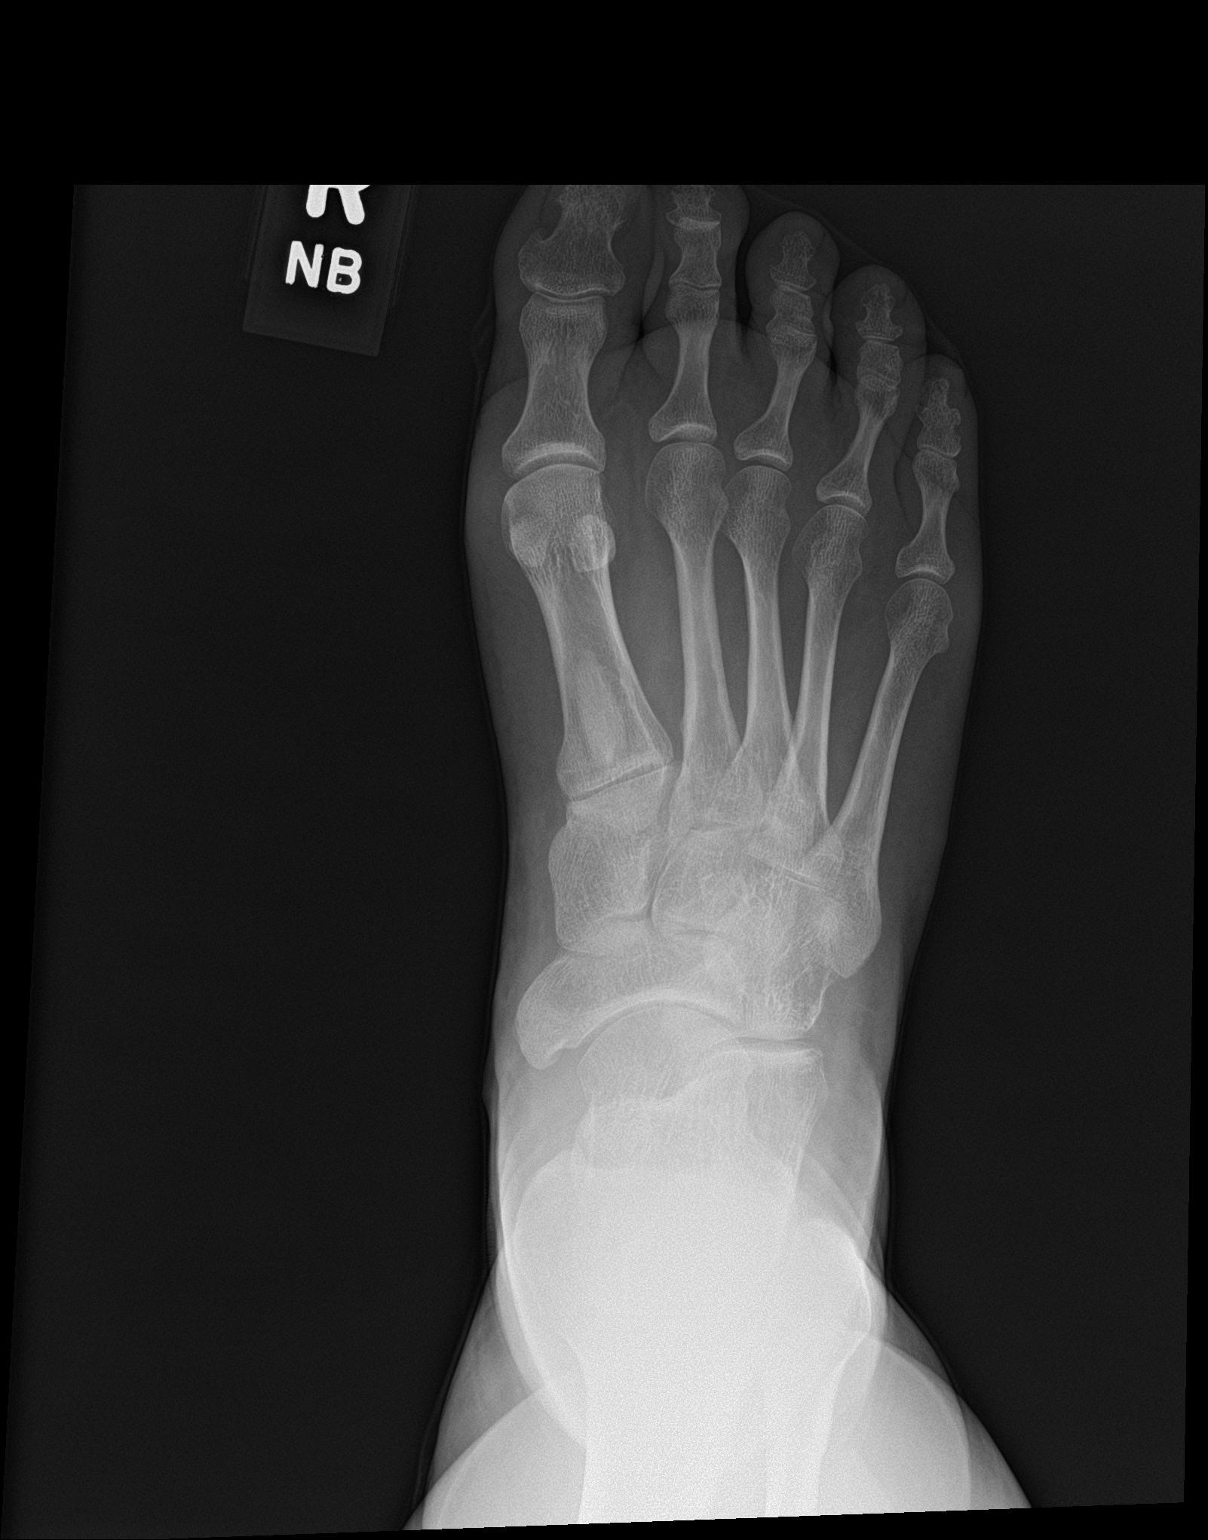

[3 of 3 positions shown; findings below may reference images not displayed]

FINDINGS: A moderate size calcaneal spur is identified. No fracture or
dislocation. No other abnormalities.
IMPRESSION: Moderate sized calcaneal spur.  No other abnormalities.
